# Patient Record
Sex: Male | Born: 1955
Health system: Southern US, Community
[De-identification: ages and names within clinical notes are randomized; demographics above are authoritative.]

## PROBLEM LIST (undated history)

## (undated) DIAGNOSIS — E785 Hyperlipidemia, unspecified: Secondary | ICD-10-CM

## (undated) DIAGNOSIS — Z9289 Personal history of other medical treatment: Secondary | ICD-10-CM

## (undated) DIAGNOSIS — I25119 Atherosclerotic heart disease of native coronary artery with unspecified angina pectoris: Secondary | ICD-10-CM

## (undated) DIAGNOSIS — I10 Essential (primary) hypertension: Secondary | ICD-10-CM

## (undated) DIAGNOSIS — I447 Left bundle-branch block, unspecified: Secondary | ICD-10-CM

## (undated) DIAGNOSIS — G4733 Obstructive sleep apnea (adult) (pediatric): Principal | ICD-10-CM

## (undated) HISTORY — DX: Obstructive sleep apnea (adult) (pediatric): G47.33

## (undated) HISTORY — DX: Essential (primary) hypertension: I10

## (undated) HISTORY — DX: Hyperlipidemia, unspecified: E78.5

## (undated) HISTORY — DX: Atherosclerotic heart disease of native coronary artery with unspecified angina pectoris: I25.119

## (undated) HISTORY — DX: Left bundle-branch block, unspecified: I44.7

## (undated) HISTORY — DX: Personal history of other medical treatment: Z92.89

---

## 1998-03-25 ENCOUNTER — Ambulatory Visit (HOSPITAL_COMMUNITY): Admission: RE | Admit: 1998-03-25 | Discharge: 1998-03-25 | Payer: Self-pay | Admitting: Anesthesiology

## 1998-10-28 ENCOUNTER — Ambulatory Visit (HOSPITAL_COMMUNITY): Admission: RE | Admit: 1998-10-28 | Discharge: 1998-10-28 | Payer: Self-pay | Admitting: Neurology

## 1998-10-28 ENCOUNTER — Encounter: Payer: Self-pay | Admitting: Neurology

## 1998-10-28 ENCOUNTER — Ambulatory Visit (HOSPITAL_COMMUNITY): Admission: RE | Admit: 1998-10-28 | Discharge: 1998-10-28 | Payer: Self-pay | Admitting: Family Medicine

## 1998-10-29 ENCOUNTER — Ambulatory Visit (HOSPITAL_COMMUNITY): Admission: RE | Admit: 1998-10-29 | Discharge: 1998-10-29 | Payer: Self-pay | Admitting: Anesthesiology

## 1999-03-25 ENCOUNTER — Encounter: Payer: Self-pay | Admitting: Family Medicine

## 1999-03-25 ENCOUNTER — Ambulatory Visit (HOSPITAL_COMMUNITY): Admission: RE | Admit: 1999-03-25 | Discharge: 1999-03-25 | Payer: Self-pay | Admitting: Family Medicine

## 1999-08-13 ENCOUNTER — Ambulatory Visit (HOSPITAL_COMMUNITY): Admission: RE | Admit: 1999-08-13 | Discharge: 1999-08-13 | Payer: Self-pay | Admitting: Gastroenterology

## 1999-08-13 ENCOUNTER — Encounter (INDEPENDENT_AMBULATORY_CARE_PROVIDER_SITE_OTHER): Payer: Self-pay | Admitting: *Deleted

## 2002-12-21 ENCOUNTER — Ambulatory Visit (HOSPITAL_BASED_OUTPATIENT_CLINIC_OR_DEPARTMENT_OTHER): Admission: RE | Admit: 2002-12-21 | Discharge: 2002-12-21 | Payer: Self-pay | Admitting: Urology

## 2002-12-21 ENCOUNTER — Encounter: Payer: Self-pay | Admitting: Urology

## 2003-06-27 ENCOUNTER — Ambulatory Visit (HOSPITAL_COMMUNITY): Admission: RE | Admit: 2003-06-27 | Discharge: 2003-06-28 | Payer: Self-pay | Admitting: Interventional Cardiology

## 2003-06-27 ENCOUNTER — Encounter: Payer: Self-pay | Admitting: Interventional Cardiology

## 2004-03-08 ENCOUNTER — Ambulatory Visit (HOSPITAL_COMMUNITY): Admission: RE | Admit: 2004-03-08 | Discharge: 2004-03-08 | Payer: Self-pay | Admitting: Urology

## 2004-05-08 ENCOUNTER — Inpatient Hospital Stay (HOSPITAL_BASED_OUTPATIENT_CLINIC_OR_DEPARTMENT_OTHER): Admission: RE | Admit: 2004-05-08 | Discharge: 2004-05-08 | Payer: Self-pay | Admitting: Interventional Cardiology

## 2004-05-16 ENCOUNTER — Inpatient Hospital Stay (HOSPITAL_COMMUNITY): Admission: RE | Admit: 2004-05-16 | Discharge: 2004-05-19 | Payer: Self-pay | Admitting: Cardiothoracic Surgery

## 2005-09-13 ENCOUNTER — Ambulatory Visit (HOSPITAL_COMMUNITY): Admission: RE | Admit: 2005-09-13 | Discharge: 2005-09-13 | Payer: Self-pay | Admitting: Urology

## 2006-01-21 ENCOUNTER — Ambulatory Visit (HOSPITAL_COMMUNITY): Admission: RE | Admit: 2006-01-21 | Discharge: 2006-01-21 | Payer: Self-pay | Admitting: Urology

## 2008-06-18 ENCOUNTER — Ambulatory Visit (HOSPITAL_COMMUNITY): Admission: RE | Admit: 2008-06-18 | Discharge: 2008-06-18 | Payer: Self-pay | Admitting: Obstetrics and Gynecology

## 2010-01-13 ENCOUNTER — Ambulatory Visit (HOSPITAL_COMMUNITY): Admission: RE | Admit: 2010-01-13 | Discharge: 2010-01-14 | Payer: Self-pay | Admitting: Interventional Cardiology

## 2010-04-22 IMAGING — CR DG CHEST 2V
2 series · 2 of 2 positions shown · non-contrast
Comparison: Two-view chest radiograph 03/08/2004

CLINICAL DATA: Left scapular pain.

CHEST - 2 VIEW

[view not recorded (1 of 2)]
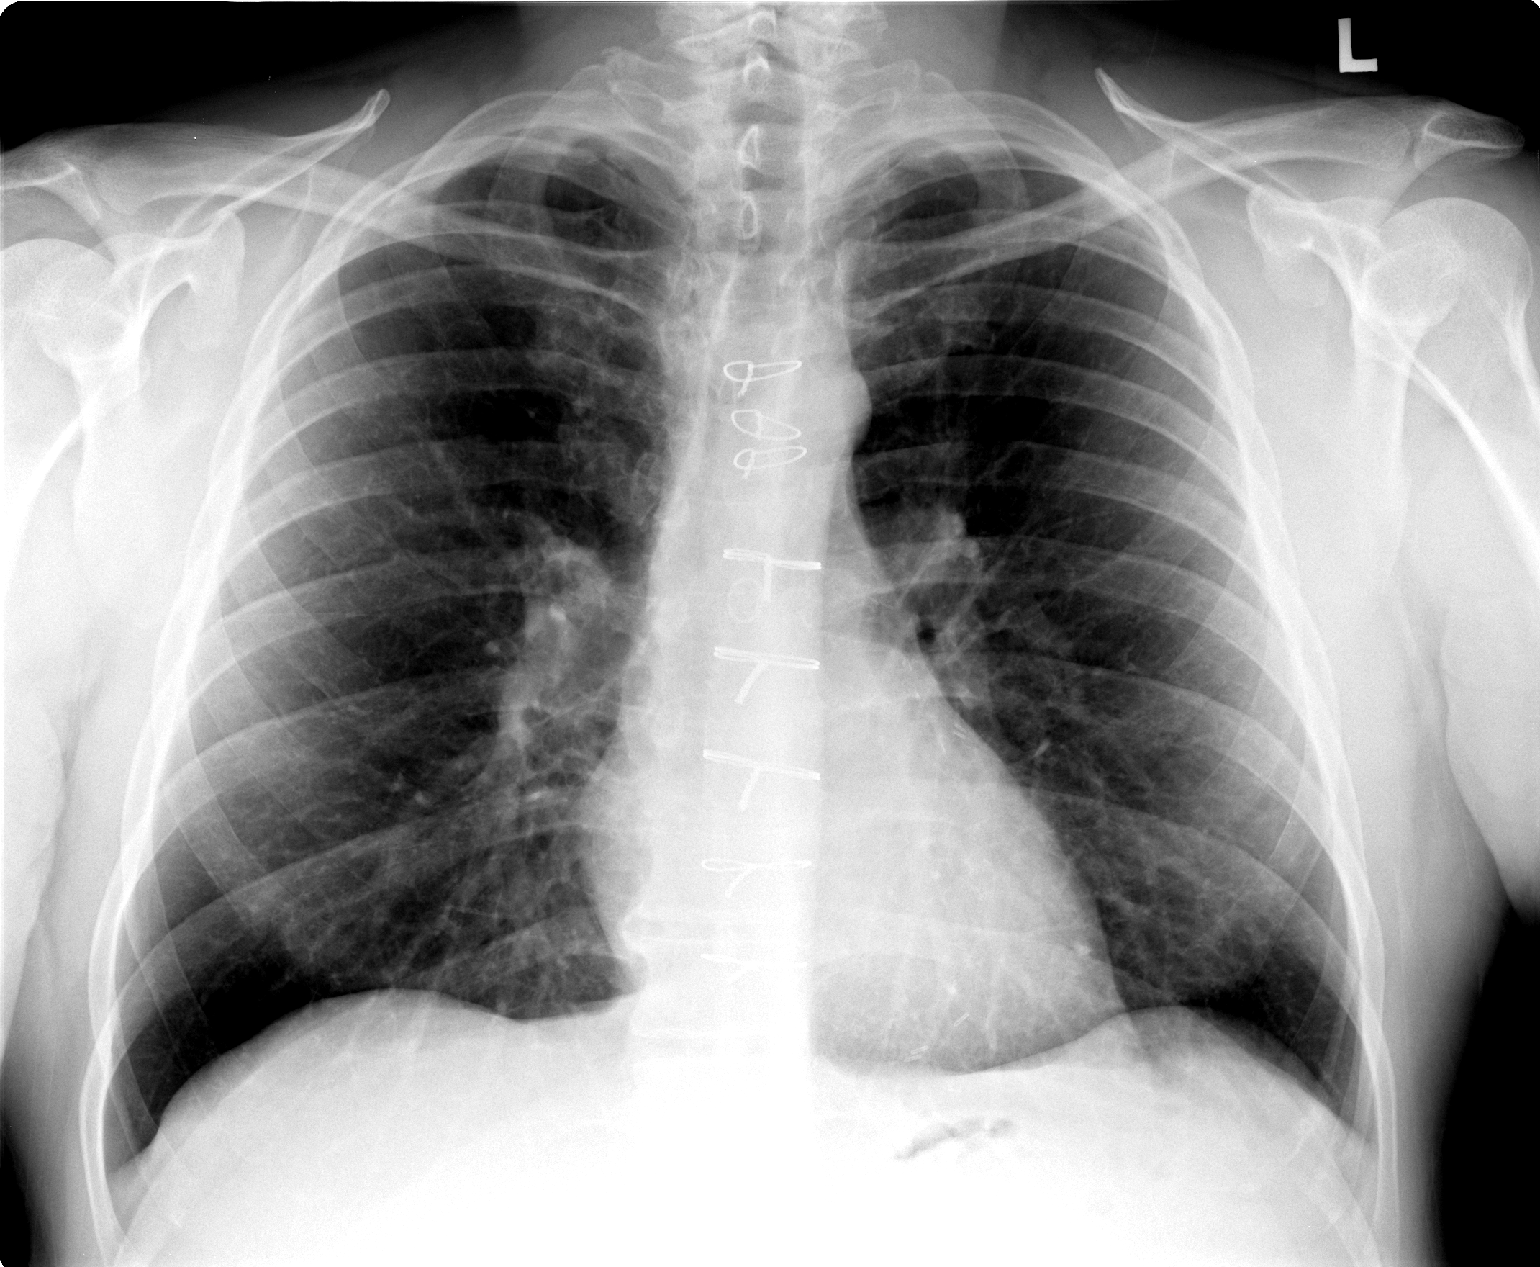

[view not recorded (2 of 2)]
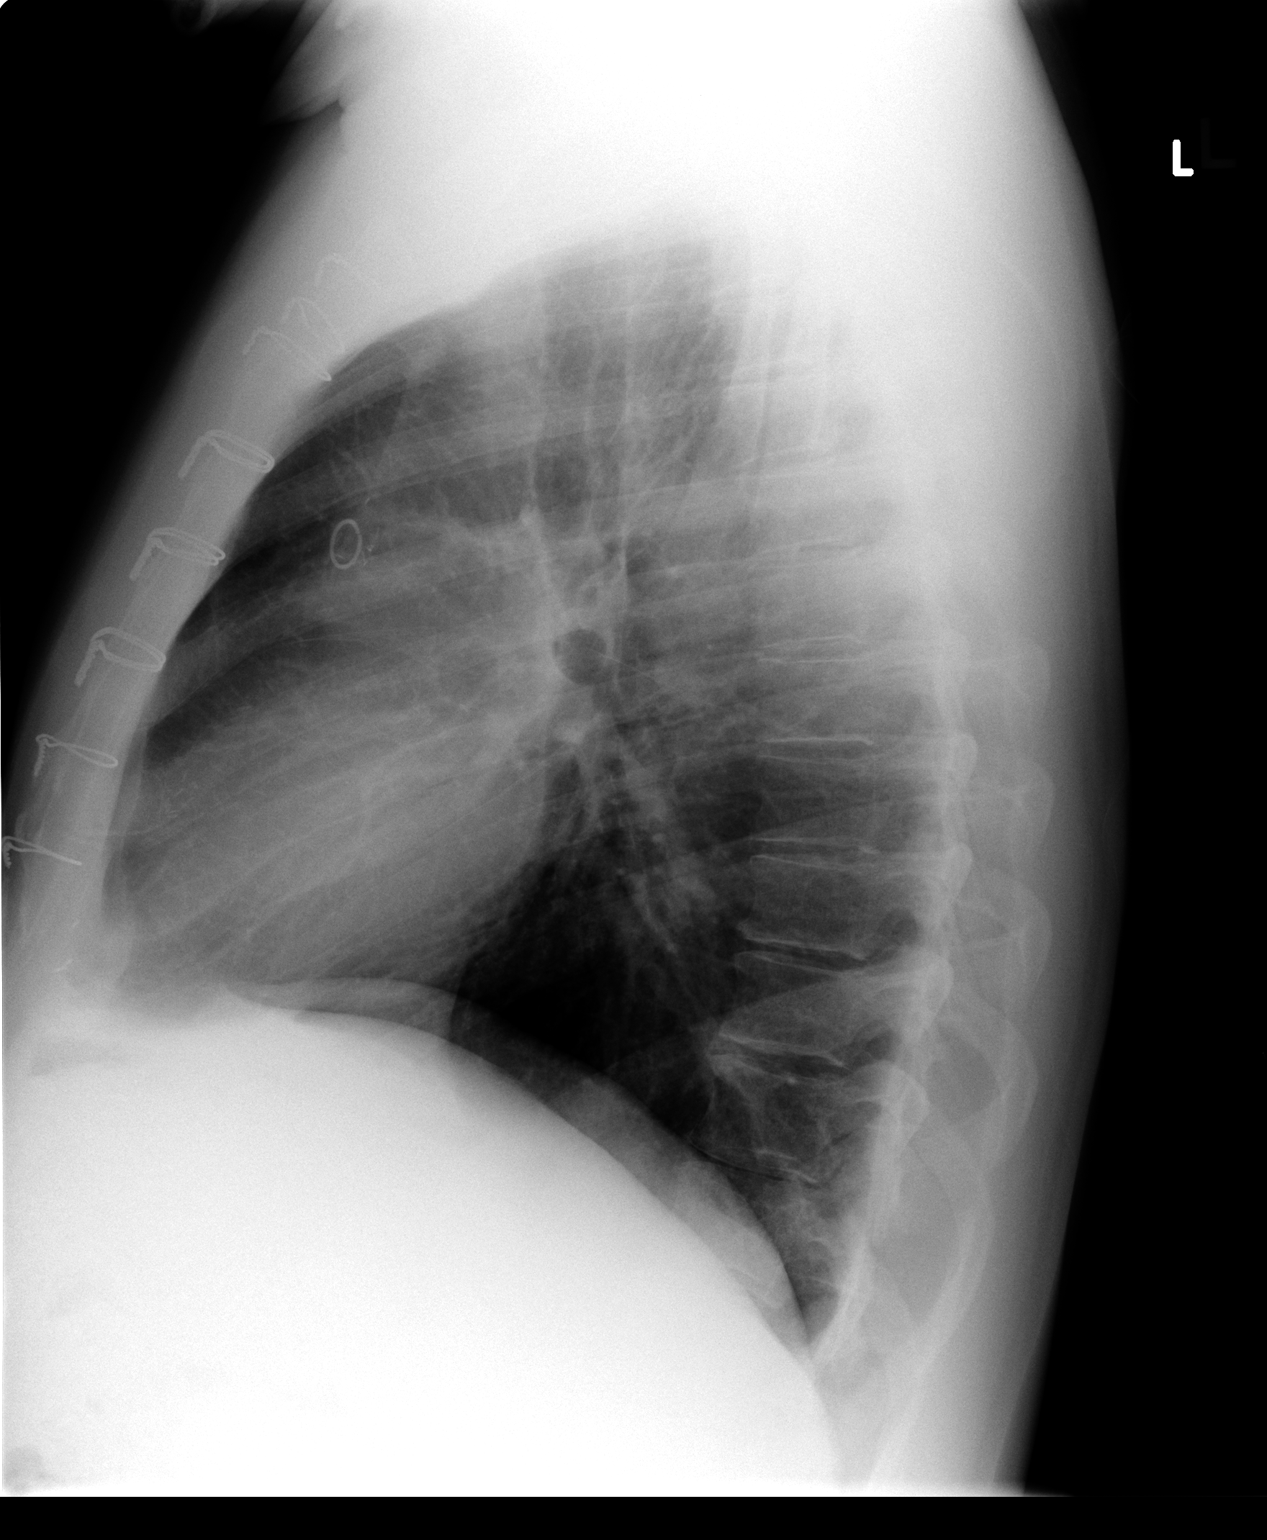

[2 of 2 positions shown; findings below may reference images not displayed]

FINDINGS: There are changes of median sternotomy for CABG.  There
are eight intact sternotomy wires.  Heart size is within normal
limits and stable.  Central pulmonary arteries are mildly prominent
and stable.  Both lungs are well expanded and clear.  No focal
airspace opacity, effusion, edema, or pneumothorax is identified.
Bony structures are unremarkable.
IMPRESSION: Postsurgical changes of CABG.  No acute findings in the chest.

## 2010-11-25 LAB — CBC
HCT: 45 % (ref 39.0–52.0)
Hemoglobin: 15.9 g/dL (ref 13.0–17.0)
MCHC: 35.4 g/dL (ref 30.0–36.0)
MCV: 94 fL (ref 78.0–100.0)
Platelets: 150 10*3/uL (ref 150–400)
RBC: 4.78 MIL/uL (ref 4.22–5.81)

## 2010-11-25 LAB — BASIC METABOLIC PANEL
Calcium: 9.1 mg/dL (ref 8.4–10.5)
Chloride: 106 mEq/L (ref 96–112)

## 2011-01-23 NOTE — Cardiovascular Report (Signed)
NAME:  Mark Payne, Mark Payne                         ACCOUNT NO.:  [id number]   MEDICAL RECORD NO.:  [id number]                   PATIENT TYPE:  OIB   LOCATION:  6501                                 FACILITY:  [facility]   PHYSICIAN:  Lesleigh Noe, [name]            DATE OF BIRTH:  [birth year]   DATE OF PROCEDURE:  05/08/2004  DATE OF DISCHARGE:  05/08/2004                              CARDIAC CATHETERIZATION   INDICATION:  History of coronary artery disease, recent abnormal Cardiolite  with apical ischemia and possible inferior ischemia, recent progression of  symptoms since the Cardiolite.  The study is being done to reassess coronary  anatomy.  Has a history of LAD drug-eluting stent implantation in October  2004.   PROCEDURE PERFORMED:  1.  Left heart catheterization.  2.  Selective coronary angiography.  3.  Left ventriculography.   DESCRIPTION:  After informed consent, a 4-French sheath was placed in the  right femoral artery using modified Seldinger technique.  A 4-French A2  multipurpose catheter was used for hemodynamic recordings, left  ventriculography by hand injection and attempted left and right coronary  angiography.  We subsequently had to use 4-French #4 left and right Judkins  catheters respectively.  The patient the tolerated the procedure without  complications.   RESULTS:   I. HEMODYNAMIC DATA:  A.  Left ventricular pressure:  113/8.  B.  Aortic pressure:  118/66.   II. LEFT VENTRICULOGRAPHY:  The left ventricle is normal in size and  demonstrates normal contractility.  EF is greater than 60%.  No MR.   III. CORONARY ANGIOGRAPHY:  A.  Left main coronary:  Distal 20% narrowing.  The vessel is widely patent without high grade obstruction.  B.  Left anterior descending coronary:  The LAD contains a proximal LAD  stent.  The stent is highly stenosed just distal to the origin of the first  diagonal.  This stenosis is at least 95% and is within the proximal margin  of the stent.  The remainder of the stent is widely patent.  The LAD is  otherwise diffusely diseased in the mid segment up to 90% and there is a  discrete 80-90% lesion within a diffusely diseased distal segment.  The  first diagonal contains proximal segmental 60-70% stenosis and the ostium of  the second diagonal contains a 70-80% stenosis.  C.  Circumflex artery:  Gives origin to one obtuse marginal branch.  There  is proximal segment narrowing up to 50%.  It is eccentric.  D.  Right coronary:  The right coronary initially before the intracoronary  administration of nitroglycerin appeared to be generally narrowed.  There is  30-50% proximal narrowing and distal 40-60% narrowing over segment before  the PDA.   CONCLUSION:  1.  In-stent restenosis of a Cypher drug-eluting stent in the proximal left      anterior descending to 95%.  There is severe diffuse mid and  distal left      anterior descending disease as well as disease involving the first and      second diagonal.  2.  There is moderate disease involving the circumflex and right coronary.  3.  Normal left ventricular function.   PLAN:  Consider surgical consultation for consideration of surgical  revascularization.  PCI on the LAD would involve extensive stenting probably  removing the possibility of coronary artery bypass grafting in the future  and would also risk loss of side branches.  Will discuss this with the  patient and before proceeding with any further interventional therapy will  encourage surgical evaluation.  With restenosis in stent in the LAD, our  future success of vessel patency with our current technology is  significantly impaired with an estimated restenosis rate in this region of  at least 30% with the best of current technology.                                               Lesleigh Noe, [name]    HWS/MEDQ  D:  05/08/2004  T:  05/09/2004  Job:  [id number]   cc:   Thelma Barge P. Modesto Charon, [name]  [address]  [place]  [place] [address]  Fax: [phone]

## 2011-01-23 NOTE — Procedures (Signed)
[facility]  Patient:    Mark Payne                       MRN: [id number] Proc. Date: 08/13/99 Adm. Date:  [id number] Attending:  Rich Brave CC:         Redmond Baseman, [name]                           Procedure Report  PROCEDURE PERFORMED:  Colonoscopy with biopsies.  ENDOSCOPIST:  Florencia Reasons, [name]  INDICATIONS:  A 55 year old for screening colonoscopy.  FINDINGS:  Several diminutive polyps in the proximal ascending colon, biopsied.  DESCRIPTION OF PROCEDURE:  The nature, purpose and risks of the procedure were familiar to the patient who provided written consent.  Sedation was fentanyl 50 mcg and Versed 6 mg IV without arrhythmias or desaturation.  Digital exam of the prostate showed some slight bumpiness but no discrete nodules.  The prostate was nontender and not particularly firm.  The Olympus adult video colonoscope was advanced easily around the colon, placing the patient in the supine position and applying some external abdominal compression to facilitate passage into the base of the cecum as identified by fair visualization of the appendiceal orifice as well as the ileocecal valve.  Pullback was then performed.  Just above the cecum were several tiny (2 to 3 mm) sessile polyps, each removed by one or two cold biopsies with good hemostasis.  No other polyps were seen and there was no evidence of cancer, colitis, vascular malformations or diverticular disease. Retroflexion in the rectum was normal.  The quality of the prep was excellent, o it is felt that all areas were well seen.  The patient tolerated the procedure well and there were no apparent complications.  IMPRESSION:  Tiny proximal colonic polyps, excised by cold biopsy technique.  PLAN:  Await pathology on the biopsies with consideration for follow-up colonoscopy in about five years, depending on the histologic findings. DD:   08/13/99 TD:  08/14/99 Job: [id number]

## 2011-01-23 NOTE — Op Note (Signed)
NAME:  Mark Payne, Mark Payne                         ACCOUNT NO.:  [id number]   MEDICAL RECORD NO.:  [id number]                   PATIENT TYPE:  INP   LOCATION:  2021                                 FACILITY:  [facility]   PHYSICIAN:  Gwenith Daily. Tyrone Sage, [name]            DATE OF BIRTH:  October 21, 1955   DATE OF PROCEDURE:  05/16/2004  DATE OF DISCHARGE:                                 OPERATIVE REPORT   PREOPERATIVE DIAGNOSES:  Unstable angina and coronary occlusive disease  status post stent placement.   POSTOPERATIVE DIAGNOSIS:  Unstable angina and coronary occlusive disease  status post stent placement.   SURGICAL PROCEDURE:  Coronary artery bypass grafting x 3 with the left  internal mammary artery sequentially to the mid LAD and distal LAD, reversed  left radial artery graft to the first diagonal.   SURGEON:  Edward B. Tyrone Sage, [name]   FIRST ASSISTANT:  Carolyn A. Eustaquio Boyden.   BRIEF HISTORY:  The patient is a 55 year old male with a strong family  history of coronary artery disease who approximately one year previously  began having shortness of breath and was evaluated and noted to have  coronary occlusive disease primarily in the proximal LAD.  A drug eluting  stent was placed at that time.  Initially, the patient did well, however,  over the past weeks began having episodes of chest discomfort when  exercising in spite of increasing beta blockers.  Repeat cardiac  catheterization revealed a high grade stenosis at the proximal portion of  the drug-coated stent and also progression of disease in a relatively large  first diagonal.  The patient had luminal irregularities in the right  coronary artery and circumflex coronary artery, however, none of these were  of a critical nature.  Because of the patient's progression of disease and  site of stent restenosis, it was felt that coronary artery bypass grafting  was indicated.  The patient agreed and signed informed consent.   DESCRIPTION OF PROCEDURE:  With a Swan-Ganz catheter and arterial monitors  placed, the patient underwent general endotracheal anesthesia without  incident.  The skin of the chest and legs and left arm was prepped and  draped in the usual sterile manner.  The palmar arch of the left hand was  patent by Doppler studies with radial occlusion.  It was elected to harvest  the left radial artery, which was performed with an open technique.  A  curvilinear incision was made over the course of the radial artery.  Dissection was carried down to the radial artery and then using the Harmonic  scalpel the radial artery was harvested.  It was of excellent quality and  caliber.  It was flushed with heparinized blood solution.  The incision was  then closed with a running 3-0 Vicryl in the subcutaneous tissues and a 4-0  subcuticular stitch on skin edge.  Dry dressings were applied.  A median  sternotomy was  performed. The left internal mammary artery was then  dissected down to the pedicle graft.  The distal artery was divided and had  excellent free flow and was of good quality.  The pericardium was opened.  Overall, the ventricular function appeared preserved.  The patient was  systemically heparinized.  The ascending aorta and right atrium were  cannulated.  An aortic root cardioplegia needle was then introduced into the  ascending aorta.  The patient was placed on cardiopulmonary bypass at 2.4  liters per minute per meter squared.  The sites of anastomosis were selected  and dissected out of the epicardium.  Angiographically, the patient had  proximal and mid LAD disease and it was decided to sequentially place the  left internal mammary artery to the mid and distal LAD.  The patient's body  temperature was cooled to 30 degrees.  The aortic cross clamp was brought  up; 500 cc of cold blood potassium cardioplegia was administered with rapid  diastolic arrest of the heart.  Bicardial septal temperature  was monitored  throughout the cross clamp period.  Attention was turned first to the first  diagonal vessel, which was opened and was a larger vessel than appreciated  angiographically.  It admitted a 1.5 mm probe.  Using a running 8-0 Prolene,  distal anastomosis was performed with the radial artery.  Attention was then  turned to the mid portion of the LAD, which was opened and admitted a  1.5  mm probe proximally.  A 1 mm probe passed a short distance distally and then  met obstruction.  Past this obstruction, the LAD was then opened and the  more distal vessel to the apex admitted a 1.5 mm probe.  Using a  longitudinal side to side anastomosis between the LAD and left internal  mammary artery, it was carried out first with a running 8-0 Prolene.  The  distal stent of the mammary artery was then trimmed to the appropriate  length and the distal anastomosis was also performed with a running 8-0  Prolene.  Attention was then turned to the proximal aorta where the  previously placed aortic cardioplegia needle was removed and a punch  aortotomy was created. Using a running 7-0 Prolene the proximal anastomosis  of the radial artery was created.  Air was evacuated from the heart and the  cross clamp removed.  The anastomosis was completed.  The patient  spontaneously converted to a sinus rhythm.  The fascia of the mammary artery  was tacked to the epicardium.  Cross clamp time was 71 minutes.  With the  operative field hemostatic, two atrial and two ventricular pacing wires were  applied.  Graft marker was applied.  The patient was then ventilated and  weaned from cardiopulmonary bypass without difficulty and made  hemodynamically stable.  He was decannulated in the usual fashion.  Protamine sulfate was administered with the operative field hemostatic.  The  pericardium was loosely reapproximated.  A left pleural tube and two mediastinal tubes were left in place.  The sternum was closed with a  #6  stainless steel wire.  The fascia was closed with interrupted 0 Vicryl.  Using the On-Q drug administration system parallel to the incision on each  side and overlying the pericostal margin, the catheters were placed and  attached to the On-Q to assist in decreasing postoperative pain.  The  patient did not require any blood bank blood products and was transferred to  the surgical intensive care unit  for further postoperative care, having  tolerated the procedure without complication.                                               Gwenith Daily Tyrone Sage, [name]    Tyson Babinski  D:  05/18/2004  T:  05/18/2004  Job:  [id number]   cc:   Lyn Records III, [name]  [address]  [address]  [place]  [place] [address]  Fax: [phone]

## 2011-01-23 NOTE — Discharge Summary (Signed)
NAME:  Mark Payne, Mark Payne                         ACCOUNT NO.:  [id number]   MEDICAL RECORD NO.:  [id number]                   PATIENT TYPE:  INP   LOCATION:  2021                                 FACILITY:  [facility]   PHYSICIAN:  Gwenith Daily. Tyrone Sage, [name]            DATE OF BIRTH:  1956/02/17   DATE OF ADMISSION:  05/16/2004  DATE OF DISCHARGE:  05/19/2004                                 DISCHARGE SUMMARY   PRIMARY ADMITTING DIAGNOSIS:  Recurrent coronary artery disease.   ADDITIONAL/DISCHARGE DIAGNOSES:  1.  Recurrent coronary artery disease.  2.  Coronary artery disease status post previous percutaneous transluminal      angioplasty and stent placement to the left anterior descending.  3.  Hyperlipidemia.  4.  History of removal of thyroglossal duct cyst.   PROCEDURES PERFORMED:  Coronary artery bypass grafting x 3 (sequential left  internal mammary artery to the mid left anterior descending and distal left  anterior descending, left radial artery to the first diagonal).   HISTORY:  The patient is a 55 year old white male with a known history of  coronary artery disease.  He presented initially in October 2004 with asthma-  like symptoms.  An evaluation at that time revealed evidence of a high grade  proximal LAD lesion.  He was seen by Dr. Verdis Prime and underwent a PTA and  placement of a CYPHER stent.  His symptoms resolved.  However, over the past  2-3 weeks, he has noticed increasing shortness of breath and vague chest  pressure with exertion particularly when riding his bike.  Because of his  recurrent symptoms, he saw Dr. Katrinka Blazing and underwent a repeat cardiac  catheterization on September 1.  At that time, his catheterization showed an  in-stent restenosis of the CYPHER stent in the proximal LAD which was about  95%.  There was diffuse mid LAD disease, a 70 to 80% first diagonal  stenosis, a 70 to 80% second diagonal stenosis, a 30 to 40% right proximal  stenosis, and a 30 to  40% circumflex stenosis.  Left ventricular function  was well preserved.  Because of the in-stent restenosis and diffuse disease,  he was referred to [facility] B. Tyrone Sage, [name], for consideration of surgical  revascularization.  Dr. Tyrone Sage saw the patient and agreed that proceeding  with CABG at this point was his best option.  He had previously been on  Plavix and this was discontinued with an eye towards surgery on May 16, 2004.  After explanation of the risks, benefits, and alternatives of the  procedure, the patient agreed to proceed.   HOSPITAL COURSE:  The patient was admitted on May 16, 2004, to [facility].  He was taken to the operating room where he underwent CABG x  3 as described in detail above.  He tolerated the procedure well and was  transferred to the SICU in stable condition.  Postoperatively, he has  done  very well.  He was able to be extubated shortly after surgery.  He was  hemodynamically stable and doing well on postoperative day #1.  At that  time, he was able to be transferred to the floor.  He has been treated with  Toradol for postoperative pericarditis and is improving.  He has been  diuresed gently.  He has been started back on his beta blocker and is  maintained on normal sinus rhythm.  He has been afebrile and all other vital  signs are stable.  He has been weaned off supplemental oxygen and is  maintaining O2 saturations of greater than 90% on room air.  He has been  started on Imdur for increased patency of his radial artery graft and is  tolerating this well.  His most recent labs show a hemoglobin of 9.9,  hematocrit 27.5, platelets 105, white count 10.7, potassium 4.0, BUN 9,  creatinine 1.1.  His most recent chest x-ray shows mild bibasilar  atelectasis.  On physical exam,  his surgical incision sites are all healing  well.  He does have a mild pericardial friction rub but otherwise stable.  It is felt that, if he continues to  improve over the next 24 hours, he will  hopefully be ready for discharge on May 19, 2004.   DISCHARGE MEDICATIONS:  1.  Enteric-coated aspirin 325 mg daily.  2.  Lopressor 25 mg b.i.d.  3.  Vytorin 10/20 one daily.  4.  Imdur 30 mg daily.  5.  Tylox one to two q.4h. p.r.n. for pain.   DISCHARGE INSTRUCTIONS:  He is asked to refrain from driving, heavy lifting,  or strenuous activity.  He may continue ambulating daily and using his  incentive spirometer.  He will continue a low-fat, low-sodium diet.  He may  shower daily and clean his incisions with soap and water.   DISCHARGE FOLLOW-UP:  He is asked to make an appointment to see Dr. Katrinka Blazing in  two weeks.  He will have a chest x-ray at that visit and should bring it to  the follow-up appointment with Dr. Tyrone Sage.  The [facility] office will contact  him with an appointment to see Dr. Tyrone Sage in three weeks.  In the interim,  if he experiences any problems or has questions, he is asked to contact our  office immediately.      Coral Ceo, P.A.                        Gwenith Daily Tyrone Sage, [name]    GC/MEDQ  D:  05/18/2004  T:  05/18/2004  Job:  [id number]   cc:   Lesleigh Noe, [name]  [address]  [address]  [place]  [place] [address]  Fax: [phone]   Maryla Morrow. Modesto Charon, [name]  [address]  [place]  [place] [address]  Fax: [phone]

## 2011-11-10 ENCOUNTER — Other Ambulatory Visit: Payer: Self-pay | Admitting: Interventional Cardiology

## 2011-11-12 ENCOUNTER — Encounter (HOSPITAL_COMMUNITY): Payer: Self-pay | Admitting: Pharmacy Technician

## 2011-11-13 ENCOUNTER — Ambulatory Visit (HOSPITAL_COMMUNITY)
Admission: RE | Admit: 2011-11-13 | Discharge: 2011-11-13 | Disposition: A | Discharge status: Home / Self Care | Payer: PRIVATE HEALTH INSURANCE | Source: Ambulatory Visit | Attending: Interventional Cardiology | Admitting: Interventional Cardiology

## 2011-11-13 ENCOUNTER — Other Ambulatory Visit: Payer: Self-pay

## 2011-11-13 ENCOUNTER — Encounter (HOSPITAL_COMMUNITY)
Admission: RE | Disposition: A | Discharge status: Home / Self Care | Payer: Self-pay | Source: Ambulatory Visit | Attending: Interventional Cardiology

## 2011-11-13 DIAGNOSIS — E785 Hyperlipidemia, unspecified: Secondary | ICD-10-CM | POA: Insufficient documentation

## 2011-11-13 DIAGNOSIS — R0989 Other specified symptoms and signs involving the circulatory and respiratory systems: Secondary | ICD-10-CM | POA: Insufficient documentation

## 2011-11-13 DIAGNOSIS — R0609 Other forms of dyspnea: Secondary | ICD-10-CM | POA: Insufficient documentation

## 2011-11-13 DIAGNOSIS — Z9861 Coronary angioplasty status: Secondary | ICD-10-CM | POA: Insufficient documentation

## 2011-11-13 DIAGNOSIS — I2510 Atherosclerotic heart disease of native coronary artery without angina pectoris: Secondary | ICD-10-CM | POA: Insufficient documentation

## 2011-11-13 DIAGNOSIS — R0789 Other chest pain: Secondary | ICD-10-CM | POA: Insufficient documentation

## 2011-11-13 DIAGNOSIS — Z951 Presence of aortocoronary bypass graft: Secondary | ICD-10-CM | POA: Insufficient documentation

## 2011-11-13 HISTORY — PX: LEFT HEART CATHETERIZATION WITH CORONARY/GRAFT ANGIOGRAM: SHX5450

## 2011-11-13 SURGERY — LEFT HEART CATHETERIZATION WITH CORONARY/GRAFT ANGIOGRAM
Anesthesia: LOCAL

## 2011-11-13 MED ORDER — SODIUM CHLORIDE 0.9 % IV SOLN
250.0000 mL | INTRAVENOUS | Status: DC | PRN
  Administered 2011-11-13: 10:00:00 via INTRAVENOUS

## 2011-11-13 MED ORDER — NITROGLYCERIN 0.2 MG/ML ON CALL CATH LAB
INTRAVENOUS | Status: AC
  Filled 2011-11-13: qty 1

## 2011-11-13 MED ORDER — OXYCODONE-ACETAMINOPHEN 5-325 MG PO TABS: 1.0000 | ORAL_TABLET | ORAL | Status: DC | PRN

## 2011-11-13 MED ORDER — MIDAZOLAM HCL 2 MG/2ML IJ SOLN
INTRAMUSCULAR | Status: AC
  Filled 2011-11-13: qty 2

## 2011-11-13 MED ORDER — FENTANYL CITRATE 0.05 MG/ML IJ SOLN
INTRAMUSCULAR | Status: AC
  Filled 2011-11-13: qty 2

## 2011-11-13 MED ORDER — SODIUM CHLORIDE 0.9 % IJ SOLN: 3.0000 mL | Freq: Two times a day (BID) | INTRAMUSCULAR | Status: DC

## 2011-11-13 MED ORDER — ASPIRIN 81 MG PO CHEW: 324.0000 mg | CHEWABLE_TABLET | ORAL | Status: DC

## 2011-11-13 MED ORDER — HEPARIN (PORCINE) IN NACL 2-0.9 UNIT/ML-% IJ SOLN
INTRAMUSCULAR | Status: AC
  Filled 2011-11-13: qty 2000

## 2011-11-13 MED ORDER — LIDOCAINE HCL (PF) 1 % IJ SOLN
INTRAMUSCULAR | Status: AC
  Filled 2011-11-13: qty 30

## 2011-11-13 MED ORDER — ACETAMINOPHEN 325 MG PO TABS
ORAL_TABLET | ORAL | Status: AC
  Filled 2011-11-13: qty 2

## 2011-11-13 MED ORDER — DIAZEPAM 5 MG PO TABS
ORAL_TABLET | ORAL | Status: AC
  Administered 2011-11-13: 5 mg via ORAL
  Filled 2011-11-13: qty 1

## 2011-11-13 MED ORDER — ALPRAZOLAM 0.25 MG PO TABS: 0.2500 mg | ORAL_TABLET | Freq: Two times a day (BID) | ORAL | Status: DC | PRN

## 2011-11-13 MED ORDER — DIAZEPAM 5 MG PO TABS
5.0000 mg | ORAL_TABLET | ORAL | Status: AC
  Administered 2011-11-13: 5 mg via ORAL

## 2011-11-13 MED ORDER — SODIUM CHLORIDE 0.9 % IJ SOLN: 3.0000 mL | INTRAMUSCULAR | Status: DC | PRN

## 2011-11-13 MED ORDER — SODIUM CHLORIDE 0.9 % IV SOLN: INTRAVENOUS | Status: AC

## 2011-11-13 MED ORDER — ACETAMINOPHEN 325 MG PO TABS
650.0000 mg | ORAL_TABLET | ORAL | Status: DC | PRN
  Administered 2011-11-13: 650 mg via ORAL

## 2011-11-13 MED ORDER — SODIUM CHLORIDE 0.9 % IV SOLN: INTRAVENOUS | Status: DC

## 2011-11-13 MED ORDER — ONDANSETRON HCL 4 MG/2ML IJ SOLN: 4.0000 mg | Freq: Four times a day (QID) | INTRAMUSCULAR | Status: DC | PRN

## 2011-11-13 NOTE — Progress Notes (Signed)
Advised Mark Payne pt took am meds of ASA 324mg  & plavix 75mg . RN advised by Vinnie Langton hold ASA 324mg  & plavix 75mg  ordered by MD.

## 2011-11-13 NOTE — Op Note (Signed)
     Diagnostic Cardiac Catheterization Report  Mark Payne  56 y.o.  male December 12, 1955  Procedure Date: 11/13/2011  Referring Physician: [name], [name] Primary Cardiologist:: [name], [name]   PROCEDURE:  Left heart catheterization with selective coronary angiography, left ventriculogram.  INDICATIONS:  Recurring angina at rest and on exertion. 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 He had stents prior to CABG. Developed restenosis and had an all arterial graft procedure performed. Within the past 3 years he has had stenting of the right coronary artery with DES.  The risks, benefits, and details of the procedure were explained to the patient.  The patient verbalized understanding and wanted to proceed.  Informed written consent was obtained.  PROCEDURE TECHNIQUE:  After Xylocaine anesthesia a 5 French sheath was placed in the right femoral artery with a single anterior needle wall stick.   Coronary angiography was done using a 5 [place] A2 MP, 5 [place] internal mammary, and 5 [place] JR 4 catheter.  Left ventriculography was done using a 5 [place] A2 MP catheter.    CONTRAST:  Total of 100 cc.  COMPLICATIONS:  None.    HEMODYNAMICS:  Aortic pressure was 114/68 mmHg; LV pressure was 110/11; LVEDP 12 mm mercury.  There was no gradient between the left ventricle and aorta.    ANGIOGRAPHIC DATA:   The left main coronary artery is widely patent.  The left anterior descending artery is 95% obstruction before and after the proximal LAD stent. Beyond this point a moderate size septal perforator is noted and there is weak retrograde competitive flow from the distal LAD which is supplied by the LIMA graft. The cell to perforator is threatened. This appearance is unchanged from the cath performed prior to RCA stenting several years ago.  The left circumflex artery is eccentric mid 50% narrowing. Small to moderate in size advocating of the mid  inferolateral wall..  The right coronary artery is  Dominant vessel with left ventricular branches and PDA. Multiple luminal irregularities are noted throughout the right coronary. The mid vessel and distal stents previously placed a widely patent. The right coronary was imaged after 200 mcg of intracoronary nitroglycerin. No evidence of restenosis is noted to.  LEFT VENTRICULOGRAM:  Left ventricular angiogram was done in the 30 RAO projection and revealed normal left ventricular wall motion and systolic function with an estimated ejection fraction of 60 %.  BYPASS GRAFTS:   Free radial to diagonal graft: Widely patent.  LIMA to LAD: Widely patent  IMPRESSIONS:  1. Severe native coronary disease with high-grade/subtotal occlusion of the proximal LAD and threatened cell to perforator #1. This anatomy is unchanged from the last catheterization several years ago..  2. Eccentric 50-60% stenosis in the mid circumflex.  3. Diffusely diseased native right coronary artery with widely patent mid and distal stent and no focal obstruction greater than 50%.  4. Patent LIMA to LAD. Patent free radial graft to diagonal.  5. Normal left ventricular systolic function   RECOMMENDATION:    1. Attempt to identify alternative explanations for the patient's discomfort.  2. If this discomfort is cardiac at this time and was seem that soba perforated territory ischemia could be the source. Pes valgus region could be done but given the absence of significant change in appearance since the last study, conservative management will be undertaken at this time.  3. Will review and compare to prior studies.Marland Kitchen

## 2011-11-13 NOTE — H&P (Signed)
See scanned office notes 

## 2011-11-13 NOTE — Discharge Instructions (Signed)

## 2011-11-13 NOTE — H&P (Signed)
There is no change in her clinical status since the pre-procedure H&P was performed last week in the office. They have been intermittent episodes of prolonged heaviness in the chest responsive to nitroglycerin. The patient has extensive coronary disease with prior LIMA to LAD and a radial graft to the first diagonal. Drug alluding stents were implanted into the mid and distal right coronary subsequent to surgery. This study is being done to define graft patency and to rule out restenosis and/or progression of native disease.

## 2014-04-02 ENCOUNTER — Ambulatory Visit (INDEPENDENT_AMBULATORY_CARE_PROVIDER_SITE_OTHER): Payer: PRIVATE HEALTH INSURANCE | Admitting: Interventional Cardiology

## 2014-04-02 ENCOUNTER — Encounter: Payer: Self-pay | Admitting: Interventional Cardiology

## 2014-04-02 VITALS — BP 132/84 | HR 50 | Wt 181.0 lb

## 2014-04-02 DIAGNOSIS — I208 Other forms of angina pectoris: Secondary | ICD-10-CM | POA: Insufficient documentation

## 2014-04-02 DIAGNOSIS — I2089 Other forms of angina pectoris: Secondary | ICD-10-CM | POA: Insufficient documentation

## 2014-04-02 DIAGNOSIS — E785 Hyperlipidemia, unspecified: Secondary | ICD-10-CM

## 2014-04-02 DIAGNOSIS — I447 Left bundle-branch block, unspecified: Secondary | ICD-10-CM

## 2014-04-02 DIAGNOSIS — I25119 Atherosclerotic heart disease of native coronary artery with unspecified angina pectoris: Secondary | ICD-10-CM | POA: Insufficient documentation

## 2014-04-02 DIAGNOSIS — I25810 Atherosclerosis of coronary artery bypass graft(s) without angina pectoris: Secondary | ICD-10-CM

## 2014-04-02 DIAGNOSIS — I209 Angina pectoris, unspecified: Secondary | ICD-10-CM

## 2014-04-02 DIAGNOSIS — I10 Essential (primary) hypertension: Secondary | ICD-10-CM

## 2014-04-02 DIAGNOSIS — I25709 Atherosclerosis of coronary artery bypass graft(s), unspecified, with unspecified angina pectoris: Secondary | ICD-10-CM

## 2014-04-02 HISTORY — DX: Left bundle-branch block, unspecified: I44.7

## 2014-04-02 HISTORY — DX: Essential (primary) hypertension: I10

## 2014-04-02 HISTORY — DX: Hyperlipidemia, unspecified: E78.5

## 2014-04-02 HISTORY — DX: Atherosclerotic heart disease of native coronary artery with unspecified angina pectoris: I25.119

## 2014-04-02 MED ORDER — NITROGLYCERIN 0.4 MG SL SUBL: 0.4000 mg | SUBLINGUAL_TABLET | SUBLINGUAL | Status: DC | PRN

## 2014-04-02 NOTE — Progress Notes (Signed)
Patient ID: Mark Payne, male   DOB: Oct 26, 1955, 58 y.o.   MRN: [id number]    [address], [address] [place], [place]  [address] Phone: [phone] Fax:  [phone]  Date:  04/02/2014   ID:  Mark, Payne 1956/02/12, MRN [id number]  PCP:  No primary provider on file.   ASSESSMENT:  1. 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 Hyperlipidemia, but currently off statin therapy 3. New Left Bundle branch block 4. Hypertension 5. Medication noncompliance  PLAN:  1. Prescription for nitroglycerin is prescribed to his pharmacy. He is asked to use it if he has prolonged chest discomfort . 2. Pharmacologic nuclear perfusion study as soon as possible. The patient refuses to have it prior to September 1 because of insurance coverage labs 3. 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 He is not complaining of chest discomfort but had an EKG tracing done at his workplace in [place] and noted that he had a widened QRS complex and became concerned. He states that he had discontinued exercise for quite some time, stopped taking his medications including statin therapy, and was under a lot of stress related to his job. He now has a new job in [place] for the past month has begun exercising with his daughter. He notes that after exercise he has tightness in his chest. It has been intermittent. It is similar to prior complaints. After noticing the change in his EKG he felt that he should be seen urgently despite the fact that we had no office lots. We have recommended emergency room but he refused to go.   Wt Readings from Last 3 Encounters:  04/02/14 181 lb (82.101 kg)  11/13/11 190 lb (86.183 kg)  11/13/11 190 lb (86.183 kg)     Past Medical History  Diagnosis Date    . LBBB (left bundle branch block) 04/02/2014  . Hyperlipidemia 04/02/2014  . Exertional angina 04/02/2014  . Essential hypertension 04/02/2014  . CAD (coronary artery disease) of artery bypass graft 04/02/2014    LIMA to LAD and free radial to diagonal #1 20075. PCI of RCA with DES May 2011  The most recent coronary angiogram in 2013 demonstrated a patent LIMA to LAD, patent free radial to diagonal, patent right coronary artery with a patent mid and distal stent. The first of perforator was threatened. The native LAD was subtotally occluded . It was felt that the anatomy was not significantly different than the prior cath in 2011.     Current Outpatient Prescriptions  Medication Sig Dispense Refill  . aspirin 81 MG tablet Take 81 mg by mouth daily.      . clopidogrel (PLAVIX) 75 MG tablet Take 75 mg by mouth daily.      . famotidine (PEPCID) 10 MG tablet Take 10 mg by mouth 1 day or 1 dose.      . nebivolol (BYSTOLIC) 10 MG tablet Take 10 mg by mouth daily.      . nitroGLYCERIN (NITROSTAT) 0.4 MG SL tablet Place 1 tablet (0.4 mg total) under the tongue every 5 (five) minutes as needed for chest pain.  25 tablet  3  . rosuvastatin (CRESTOR) 10 MG tablet Take 10 mg by mouth daily.  No current facility-administered medications for this visit.    Allergies:   No Known Allergies  Social History:  The patient  reports that he has quit smoking. He does not have any smokeless tobacco history on file. He reports that he drinks alcohol. He reports that he does not use illicit drugs.   ROS:  Please see the history of present illness.   He admits to his significant emotional stress. He denies palpitations and syncope. He denies exertional chest complaints but has angina at rest. He has not been using nitroglycerin. For some reason he is converted to a natural pathic approach rather than taking a statin and blood pressure therapy. He states he has lost 10 pounds by exercising recently. 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   All other systems reviewed and negative.   OBJECTIVE: VS:  BP 132/84  Pulse 50  Wt 181 lb (82.101 kg) Well nourished, well developed, in no acute distress, skin color is good. HEENT: normal Neck: JVD flat. Carotid bruit absent  Cardiac:  normal S1, S2; RRR; no murmur Lungs:  clear to auscultation bilaterally, no wheezing, rhonchi or rales Abd: soft, nontender, no hepatomegaly Ext: Edema absent. Pulses 2+ and symmetric bilateral Skin: warm and dry Neuro:  CNs 2-12 intact, no focal abnormalities noted  EKG:  Sinus bradycardia with left bundle branch block, left abnormality. New compared to 2013 tracings       Signed, Illene Labrador III, MD 04/02/2014 5:24 PM  Past Medical History  CAD with CABG with LIMA to LAD and fee radial to D#1, 2005; PCI with RCA stent 01/2010   Hyperlipidemia   Hypertension

## 2014-04-02 NOTE — Patient Instructions (Addendum)
Your physician has recommended you make the following change in your medication:  1) START Nitro as directed an Rx has been sent to your pharmacy   Your physician has requested that you have a lexiscan myoview. For further information please visit [email or web address]. Please follow instruction sheet, as given.

## 2014-05-08 ENCOUNTER — Ambulatory Visit (HOSPITAL_COMMUNITY): Payer: No Typology Code available for payment source | Attending: Cardiovascular Disease | Admitting: Radiology

## 2014-05-08 VITALS — BP 143/97 | HR 43 | Ht 68.0 in | Wt 180.0 lb

## 2014-05-08 DIAGNOSIS — I447 Left bundle-branch block, unspecified: Secondary | ICD-10-CM | POA: Insufficient documentation

## 2014-05-08 DIAGNOSIS — Z951 Presence of aortocoronary bypass graft: Secondary | ICD-10-CM | POA: Diagnosis not present

## 2014-05-08 DIAGNOSIS — I2510 Atherosclerotic heart disease of native coronary artery without angina pectoris: Secondary | ICD-10-CM | POA: Insufficient documentation

## 2014-05-08 DIAGNOSIS — I209 Angina pectoris, unspecified: Secondary | ICD-10-CM

## 2014-05-08 DIAGNOSIS — R0609 Other forms of dyspnea: Secondary | ICD-10-CM | POA: Diagnosis not present

## 2014-05-08 DIAGNOSIS — R9439 Abnormal result of other cardiovascular function study: Secondary | ICD-10-CM | POA: Insufficient documentation

## 2014-05-08 DIAGNOSIS — Z8249 Family history of ischemic heart disease and other diseases of the circulatory system: Secondary | ICD-10-CM | POA: Insufficient documentation

## 2014-05-08 DIAGNOSIS — Z9861 Coronary angioplasty status: Secondary | ICD-10-CM | POA: Diagnosis not present

## 2014-05-08 DIAGNOSIS — R0789 Other chest pain: Secondary | ICD-10-CM | POA: Diagnosis not present

## 2014-05-08 DIAGNOSIS — I25810 Atherosclerosis of coronary artery bypass graft(s) without angina pectoris: Secondary | ICD-10-CM

## 2014-05-08 DIAGNOSIS — R0989 Other specified symptoms and signs involving the circulatory and respiratory systems: Secondary | ICD-10-CM | POA: Insufficient documentation

## 2014-05-08 MED ORDER — TECHNETIUM TC 99M SESTAMIBI GENERIC - CARDIOLITE
30.0000 | Freq: Once | INTRAVENOUS | Status: AC | PRN
  Administered 2014-05-08: 30 via INTRAVENOUS

## 2014-05-08 MED ORDER — TECHNETIUM TC 99M SESTAMIBI GENERIC - CARDIOLITE
10.0000 | Freq: Once | INTRAVENOUS | Status: AC | PRN
  Administered 2014-05-08: 10 via INTRAVENOUS

## 2014-05-08 MED ORDER — ADENOSINE (DIAGNOSTIC) 3 MG/ML IV SOLN
0.5600 mg/kg | Freq: Once | INTRAVENOUS | Status: AC
  Administered 2014-05-08: 45.6 mg via INTRAVENOUS

## 2014-05-08 NOTE — Progress Notes (Signed)
Mark Payne, Mark Payne [phone]    Cardiology Nuclear Med Study  Mark Payne is a 58 y.o. male     MRN : [id number]     DOB: August 07, 1956  Procedure Date: 05/08/2014  Nuclear Med Background Indication for Stress Test:  Evaluation for Ischemia, Graft Patency and Stent Patency History:  CAD; CABG; Stent; 2011 MPI-mild ischemia Cardiac Risk Factors: Family History - CAD, History of Smoking, Hypertension, LBBB and Lipids  Symptoms:  Chest Tightness    Nuclear Pre-Procedure Caffeine/Decaff Intake:  None NPO After: 8:00pm   Lungs:  clear O2 Sat: 96% on room air. IV 0.9% NS with Angio Cath:  20g  IV Site: R Hand  IV Started by:  Matilde Haymaker, RN  Chest Size (in):  44 Cup Size: n/a  Height: 5\' 8"  (1.727 m)  Weight:  180 lb (81.647 kg)  BMI:  Body mass index is 27.38 kg/(m^2). Tech Comments:  Held bystolic 48GQB    Nuclear Med Study 1 or 2 day study: 1 day  Stress Test Type:  Adenosine  Reading MD: Wells Guiles, MD  Order Authorizing Provider:  Linard Millers, MD  Resting Radionuclide: Technetium 65m Sestamibi  Resting Radionuclide Dose: 11.0 mCi   Stress Radionuclide:  Technetium 67m Sestamibi  Stress Radionuclide Dose: 33.0 mCi           Stress Protocol Rest HR: 43 Stress HR: 69  Rest BP: 143/97 Stress BP: 147/93  Exercise Time (min): n/a METS: n/a           Dose of Adenosine (mg):  45.8 mg Dose of Lexiscan: n/a mg  Dose of Atropine (mg): n/a Dose of Dobutamine: n/a mcg/kg/min (at max HR)  Stress Test Technologist: Glade Lloyd, BS-ES  Nuclear Technologist:  Annye Rusk, CNMT     Rest Procedure:  Myocardial perfusion imaging was performed at rest 45 minutes following the intravenous administration of Technetium 11m Sestamibi. Rest ECG: NSR-LBBB  Stress Procedure:  The patient received IV adenosine at 140 mcg/kg/min for 4 minutes.  Technetium 46m Sestamibi was injected at the 2 minute mark and quantitative  spect images were obtained after a 45 minute delay.  During the infusion of Adenosine the patient complained of tension in his neck and chest pressure.  These symptoms completely resolved in recovery.  Stress ECG: No significant change from baseline ECG  QPS Raw Data Images:  Normal; no motion artifact; normal heart/lung ratio. Stress Images:  Fixed anteroseptal and apical defects Rest Images:  Fixed anteroseptal and apical defects Subtraction (SDS):  No evidence of ischemia. Transient Ischemic Dilatation (Normal <1.22):  1.16 Lung/Heart Ratio (Normal <0.45):  0.39  Quantitative Gated Spect Images QGS EDV:  160 ml QGS ESV:  81 ml  Impression Exercise Capacity:  Adenosine study with no exercise. BP Response:  Normal blood pressure response. Clinical Symptoms:  Mild chest pain/dyspnea. ECG Impression:  No significant ST segment change with adenosine. Comparison with Prior Nuclear Study: No previous nuclear study performed  Overall Impression:  Intermediate risk stress nuclear study with fixed anteroseptal and apical defects suggestive of LAD territory scar.  No reversible ischemia. Myocardial thinning is noted in these areas.  LV Ejection Fraction: 50%.  LV Wall Motion:  Anteroseptal and apical hypokinesis  Mark Casino, MD, [facility] Board Certified in Nuclear Cardiology Attending Cardiologist [place]

## 2014-05-09 ENCOUNTER — Telehealth: Payer: Self-pay | Admitting: Interventional Cardiology

## 2014-05-09 NOTE — Telephone Encounter (Signed)
Message copied by Lamar Laundry on Wed May 09, 2014  4:46 PM ------      Message from: Daneen Schick      Created: Wed May 09, 2014  2:53 PM       It appears there has been an anterior MI. No way to determine age. Needs cath to further evaluate patency of LIMA to LAD graft. ------

## 2014-05-09 NOTE — Telephone Encounter (Signed)
returned pt call.pt aware of myoview results.It appears there has been an anterior MI. No way to determine age. Needs cath to further evaluate patency of LIMA to LAD graft.pt rqst copy be faxed to him [phone].pt rqst a call from [place] to discuss before proceeding. Message routed to [place]

## 2014-05-09 NOTE — Telephone Encounter (Signed)
Message copied by Lamar Laundry on Wed May 09, 2014  4:59 PM ------      Message from: Daneen Schick      Created: Wed May 09, 2014  2:53 PM       It appears there has been an anterior MI. No way to determine age. Needs cath to further evaluate patency of LIMA to LAD graft. ------

## 2014-05-09 NOTE — Telephone Encounter (Signed)
New message         Pt would like for Lattie Haw to give him a call / pt didn't disclose any info

## 2014-05-10 ENCOUNTER — Telehealth: Payer: Self-pay | Admitting: Interventional Cardiology

## 2014-05-10 DIAGNOSIS — R9439 Abnormal result of other cardiovascular function study: Secondary | ICD-10-CM

## 2014-05-10 NOTE — Telephone Encounter (Signed)
pt aware Cardiac cath sch on 9/11 @9am  with Dr.Smith. pt given verbal preprocedure instructions. written instructions mailed to pt.pt will come in on 9/9 to have preprocedure lab and go to GI for a cxr.pt thanked me for my help and verbalized understanding

## 2014-05-11 ENCOUNTER — Other Ambulatory Visit: Payer: Self-pay | Admitting: Interventional Cardiology

## 2014-05-11 DIAGNOSIS — I209 Angina pectoris, unspecified: Secondary | ICD-10-CM

## 2014-05-16 ENCOUNTER — Ambulatory Visit
Admission: RE | Admit: 2014-05-16 | Discharge: 2014-05-16 | Disposition: A | Discharge status: Home / Self Care | Payer: No Typology Code available for payment source | Source: Ambulatory Visit | Attending: Interventional Cardiology | Admitting: Interventional Cardiology

## 2014-05-16 ENCOUNTER — Encounter (HOSPITAL_COMMUNITY): Payer: Self-pay | Admitting: Pharmacy Technician

## 2014-05-16 ENCOUNTER — Other Ambulatory Visit (INDEPENDENT_AMBULATORY_CARE_PROVIDER_SITE_OTHER): Payer: No Typology Code available for payment source

## 2014-05-16 DIAGNOSIS — R9439 Abnormal result of other cardiovascular function study: Secondary | ICD-10-CM

## 2014-05-16 LAB — BASIC METABOLIC PANEL
BUN: 8 mg/dL (ref 6–23)
CHLORIDE: 104 meq/L (ref 96–112)
CO2: 27 meq/L (ref 19–32)
Calcium: 9.5 mg/dL (ref 8.4–10.5)
Creatinine, Ser: 1.1 mg/dL (ref 0.4–1.5)
GFR: 70.32 mL/min (ref >60.00)
Glucose, Bld: 85 mg/dL (ref 70–99)
POTASSIUM: 4.5 meq/L (ref 3.5–5.1)
SODIUM: 139 meq/L (ref 135–145)

## 2014-05-16 LAB — CBC WITH DIFFERENTIAL/PLATELET
BASOS PCT: 0.6 % (ref 0.0–3.0)
Basophils Absolute: 0 10*3/uL (ref 0.0–0.1)
EOS ABS: 0.1 10*3/uL (ref 0.0–0.7)
EOS PCT: 1.8 % (ref 0.0–5.0)
HCT: 47.9 % (ref 39.0–52.0)
HEMOGLOBIN: 16.6 g/dL (ref 13.0–17.0)
Lymphocytes Relative: 21.7 % (ref 12.0–46.0)
Lymphs Abs: 1.5 10*3/uL (ref 0.7–4.0)
MCHC: 34.6 g/dL (ref 30.0–36.0)
MCV: 98.4 fl (ref 78.0–100.0)
MONO ABS: 0.7 10*3/uL (ref 0.1–1.0)
Monocytes Relative: 9.3 % (ref 3.0–12.0)
NEUTROS ABS: 4.7 10*3/uL (ref 1.4–7.7)
NEUTROS PCT: 66.6 % (ref 43.0–77.0)
Platelets: 190 10*3/uL (ref 150.0–400.0)
RBC: 4.87 Mil/uL (ref 4.22–5.81)
RDW: 13.9 % (ref 11.5–15.5)
WBC: 7.1 10*3/uL (ref 4.0–10.5)

## 2014-05-16 LAB — PROTIME-INR
INR: 1.3 ratio — ABNORMAL HIGH (ref 0.8–1.0)
PROTHROMBIN TIME: 14.6 s — AB (ref 9.6–13.1)

## 2014-05-18 ENCOUNTER — Encounter (HOSPITAL_COMMUNITY)
Admission: RE | Disposition: A | Discharge status: Home / Self Care | Payer: No Typology Code available for payment source | Source: Ambulatory Visit | Attending: Interventional Cardiology

## 2014-05-18 ENCOUNTER — Ambulatory Visit (HOSPITAL_COMMUNITY)
Admission: RE | Admit: 2014-05-18 | Discharge: 2014-05-19 | Disposition: A | Discharge status: Home / Self Care | Payer: No Typology Code available for payment source | Source: Ambulatory Visit | Attending: Interventional Cardiology | Admitting: Interventional Cardiology

## 2014-05-18 DIAGNOSIS — I2510 Atherosclerotic heart disease of native coronary artery without angina pectoris: Secondary | ICD-10-CM | POA: Insufficient documentation

## 2014-05-18 DIAGNOSIS — Z951 Presence of aortocoronary bypass graft: Secondary | ICD-10-CM | POA: Diagnosis not present

## 2014-05-18 DIAGNOSIS — E785 Hyperlipidemia, unspecified: Secondary | ICD-10-CM | POA: Insufficient documentation

## 2014-05-18 DIAGNOSIS — I447 Left bundle-branch block, unspecified: Secondary | ICD-10-CM | POA: Diagnosis not present

## 2014-05-18 DIAGNOSIS — Z9861 Coronary angioplasty status: Secondary | ICD-10-CM | POA: Diagnosis not present

## 2014-05-18 DIAGNOSIS — F411 Generalized anxiety disorder: Secondary | ICD-10-CM | POA: Diagnosis not present

## 2014-05-18 DIAGNOSIS — Z7902 Long term (current) use of antithrombotics/antiplatelets: Secondary | ICD-10-CM | POA: Insufficient documentation

## 2014-05-18 DIAGNOSIS — I209 Angina pectoris, unspecified: Secondary | ICD-10-CM | POA: Diagnosis not present

## 2014-05-18 DIAGNOSIS — I208 Other forms of angina pectoris: Secondary | ICD-10-CM | POA: Diagnosis present

## 2014-05-18 DIAGNOSIS — Z7982 Long term (current) use of aspirin: Secondary | ICD-10-CM | POA: Insufficient documentation

## 2014-05-18 DIAGNOSIS — G4700 Insomnia, unspecified: Secondary | ICD-10-CM | POA: Insufficient documentation

## 2014-05-18 DIAGNOSIS — I25119 Atherosclerotic heart disease of native coronary artery with unspecified angina pectoris: Secondary | ICD-10-CM | POA: Diagnosis present

## 2014-05-18 DIAGNOSIS — Z87891 Personal history of nicotine dependence: Secondary | ICD-10-CM | POA: Diagnosis not present

## 2014-05-18 DIAGNOSIS — I10 Essential (primary) hypertension: Secondary | ICD-10-CM | POA: Diagnosis not present

## 2014-05-18 DIAGNOSIS — I25700 Atherosclerosis of coronary artery bypass graft(s), unspecified, with unstable angina pectoris: Secondary | ICD-10-CM

## 2014-05-18 HISTORY — PX: LEFT HEART CATHETERIZATION WITH CORONARY/GRAFT ANGIOGRAM: SHX5450

## 2014-05-18 LAB — POCT ACTIVATED CLOTTING TIME: ACTIVATED CLOTTING TIME: 523 s

## 2014-05-18 SURGERY — LEFT HEART CATHETERIZATION WITH CORONARY/GRAFT ANGIOGRAM
Anesthesia: LOCAL

## 2014-05-18 MED ORDER — HEPARIN (PORCINE) IN NACL 2-0.9 UNIT/ML-% IJ SOLN
INTRAMUSCULAR | Status: AC
  Filled 2014-05-18: qty 1000

## 2014-05-18 MED ORDER — ASPIRIN 81 MG PO CHEW: 81.0000 mg | CHEWABLE_TABLET | ORAL | Status: DC

## 2014-05-18 MED ORDER — ONDANSETRON HCL 4 MG/2ML IJ SOLN: 4.0000 mg | Freq: Four times a day (QID) | INTRAMUSCULAR | Status: DC | PRN

## 2014-05-18 MED ORDER — ACETAMINOPHEN 325 MG PO TABS: 650.0000 mg | ORAL_TABLET | ORAL | Status: DC | PRN

## 2014-05-18 MED ORDER — DIAZEPAM 5 MG PO TABS
5.0000 mg | ORAL_TABLET | Freq: Three times a day (TID) | ORAL | Status: DC | PRN
  Administered 2014-05-18: 5 mg via ORAL
  Filled 2014-05-18: qty 1

## 2014-05-18 MED ORDER — SODIUM CHLORIDE 0.9 % IV SOLN: INTRAVENOUS | Status: AC

## 2014-05-18 MED ORDER — SODIUM CHLORIDE 0.9 % IV SOLN: 250.0000 mL | INTRAVENOUS | Status: DC | PRN

## 2014-05-18 MED ORDER — LIDOCAINE HCL (PF) 1 % IJ SOLN
INTRAMUSCULAR | Status: AC
  Filled 2014-05-18: qty 30

## 2014-05-18 MED ORDER — NITROGLYCERIN 1 MG/10 ML FOR IR/CATH LAB
INTRA_ARTERIAL | Status: AC
  Filled 2014-05-18: qty 10

## 2014-05-18 MED ORDER — CLOPIDOGREL BISULFATE 75 MG PO TABS
ORAL_TABLET | ORAL | Status: AC
  Filled 2014-05-18: qty 2

## 2014-05-18 MED ORDER — ATORVASTATIN CALCIUM 80 MG PO TABS
80.0000 mg | ORAL_TABLET | Freq: Every day | ORAL | Status: DC
  Filled 2014-05-18 (×2): qty 1

## 2014-05-18 MED ORDER — CLOPIDOGREL BISULFATE 75 MG PO TABS
75.0000 mg | ORAL_TABLET | Freq: Every day | ORAL | Status: DC
  Administered 2014-05-19: 10:00:00 75 mg via ORAL
  Filled 2014-05-18: qty 1

## 2014-05-18 MED ORDER — FENTANYL CITRATE 0.05 MG/ML IJ SOLN
INTRAMUSCULAR | Status: AC
  Filled 2014-05-18: qty 2

## 2014-05-18 MED ORDER — MIDAZOLAM HCL 2 MG/2ML IJ SOLN
INTRAMUSCULAR | Status: AC
  Filled 2014-05-18: qty 2

## 2014-05-18 MED ORDER — SODIUM CHLORIDE 0.9 % IV SOLN
INTRAVENOUS | Status: DC
  Administered 2014-05-18: 08:00:00 via INTRAVENOUS

## 2014-05-18 MED ORDER — OXYCODONE-ACETAMINOPHEN 5-325 MG PO TABS
1.0000 | ORAL_TABLET | ORAL | Status: DC | PRN
  Administered 2014-05-19: 01:00:00 2 via ORAL
  Filled 2014-05-18: qty 2

## 2014-05-18 MED ORDER — SODIUM CHLORIDE 0.9 % IJ SOLN: 3.0000 mL | Freq: Two times a day (BID) | INTRAMUSCULAR | Status: DC

## 2014-05-18 MED ORDER — BIVALIRUDIN 250 MG IV SOLR
INTRAVENOUS | Status: AC
  Filled 2014-05-18: qty 250

## 2014-05-18 MED ORDER — ASPIRIN EC 325 MG PO TBEC
325.0000 mg | DELAYED_RELEASE_TABLET | Freq: Every day | ORAL | Status: DC
  Administered 2014-05-19: 325 mg via ORAL
  Filled 2014-05-18: qty 1

## 2014-05-18 MED ORDER — ADENOSINE 12 MG/4ML IV SOLN
16.0000 mL | Freq: Once | INTRAVENOUS | Status: AC
  Administered 2014-05-18: 48 mg via INTRAVENOUS
  Filled 2014-05-18: qty 16

## 2014-05-18 MED ORDER — SODIUM CHLORIDE 0.9 % IJ SOLN: 3.0000 mL | INTRAMUSCULAR | Status: DC | PRN

## 2014-05-18 NOTE — Progress Notes (Signed)
RESTING IN BED, NO ACUTE DISTRESS. NO CHEST PAIN, NO SOB. S BRADY PER MONITOR. CATH SITE REMAINS INTACT, NO BLEEDING, NO HEMATOMA. HOB TO 30, EATING WITHOUT COMPLICATION. EXPRESSED DESIRE TO GO HOME FOLLOWING REST TIME.

## 2014-05-18 NOTE — Progress Notes (Signed)
RECEIVED PT FROM CATH LAB IN NO ACUTE DISTRESS. NO CHEST PAIN , NO SOB. ALERT ORIENTED. CATH SITE SITE A R GROIN INTACT, NO BLEEDING, NO HEMATOMA. PEDAL PULSES +2. HOB FLAT, HAVE ADVISED PT TO REMAIN FLAT IN BED UNTIL INSTRUCTED.

## 2014-05-18 NOTE — H&P (Signed)
Mark Payne is an 58 y.o. male.        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 A recent EKG demonstrated a new left bundle. Fixed mid septal and apical perfusion defects were noted on myocardial perfusion imaging and classified as intermediate risk. No appreciable ischemia was noted. Because of ongoing ischemia and the development of a new left bundle angiography is being done to be certain that intense ischemia is not the source of the changes noted. Prior bypass surgery was performed in 2005 with LIMA to LAD, free radial to diagonal #1. This was performed in 2005. RCA had DES and 2011.  Past Medical History  Diagnosis Date  . LBBB (left bundle branch block) 04/02/2014  . Hyperlipidemia 04/02/2014  . Exertional angina 04/02/2014  . Essential hypertension 04/02/2014  . CAD (coronary artery disease) of artery bypass graft 04/02/2014    LIMA to LAD and free radial to diagonal #1 20075. PCI of RCA with DES May 2011  The most recent coronary angiogram in 2013 demonstrated a patent LIMA to LAD, patent free radial to diagonal, patent right coronary artery with a patent mid and distal stent. The first of perforator was threatened. The native LAD was subtotally occluded . It was felt that the anatomy was not significantly different than the prior cath in 2011.     No past surgical history on file.  No family history on file. Social History:  reports that he has quit smoking. He does not have any smokeless tobacco history on file. He reports that he drinks alcohol. He reports that he does not use illicit drugs.  Allergies: No Known Allergies  Medications Prior to Admission  Medication Sig Dispense Refill  . aspirin 81 MG tablet Take 81 mg by mouth daily.      . clopidogrel (PLAVIX) 75 MG tablet Take 75 mg by mouth daily.      . famotidine (PEPCID) 10 MG tablet Take 10 mg by  mouth daily as needed for heartburn.       . nebivolol (BYSTOLIC) 10 MG tablet Take 10 mg by mouth daily.      . nitroGLYCERIN (NITROSTAT) 0.4 MG SL tablet Place 1 tablet (0.4 mg total) under the tongue every 5 (five) minutes as needed for chest pain.  25 tablet  3  . rosuvastatin (CRESTOR) 10 MG tablet Take 10 mg by mouth daily.      . tamsulosin (FLOMAX) 0.4 MG CAPS capsule Take 0.4 mg by mouth every other day.      . valACYclovir (VALTREX) 1000 MG tablet Take 1,000 mg by mouth daily as needed (for oral symptoms).        Results for orders placed in visit on 05/16/14 (from the past 48 hour(s))  BASIC METABOLIC PANEL     Status: None   Collection Time    05/16/14 10:15 AM      Result Value Ref Range   Sodium 139  135 - 145 mEq/L   Potassium 4.5  3.5 - 5.1 mEq/L   Chloride 104  96 - 112 mEq/L   CO2 27  19 - 32 mEq/L   Glucose, Bld 85  70 - 99 mg/dL   BUN 8  6 - 23 mg/dL   Creatinine, Ser 1.1  0.4 - 1.5 mg/dL   Calcium 9.5  8.4 - 10.5 mg/dL   GFR 70.32  >60.00 mL/min  CBC WITH DIFFERENTIAL     Status: None   Collection Time    05/16/14 10:15 AM      Result Value Ref Range   WBC 7.1  4.0 - 10.5 K/uL   RBC 4.87  4.22 - 5.81 Mil/uL   Hemoglobin 16.6  13.0 - 17.0 g/dL   HCT 47.9  39.0 - 52.0 %   MCV 98.4  78.0 - 100.0 fl   MCHC 34.6  30.0 - 36.0 g/dL   RDW 13.9  11.5 - 15.5 %   Platelets 190.0  150.0 - 400.0 K/uL   Neutrophils Relative % 66.6  43.0 - 77.0 %   Lymphocytes Relative 21.7  12.0 - 46.0 %   Monocytes Relative 9.3  3.0 - 12.0 %   Eosinophils Relative 1.8  0.0 - 5.0 %   Basophils Relative 0.6  0.0 - 3.0 %   Neutro Abs 4.7  1.4 - 7.7 K/uL   Lymphs Abs 1.5  0.7 - 4.0 K/uL   Monocytes Absolute 0.7  0.1 - 1.0 K/uL   Eosinophils Absolute 0.1  0.0 - 0.7 K/uL   Basophils Absolute 0.0  0.0 - 0.1 K/uL  PROTIME-INR     Status: Abnormal   Collection Time    05/16/14 10:15 AM      Result Value Ref Range   INR 1.3 (*) 0.8 - 1.0 ratio   Prothrombin Time 14.6 (*) 9.6 - 13.1 sec     Dg Chest 2 View  05/16/2014   CLINICAL DATA:  Preop for cardiac catheterization  EXAM: CHEST  2 VIEW  COMPARISON:  06/18/2008  FINDINGS: There is no focal parenchymal opacity, pleural effusion, or pneumothorax. The heart and mediastinal contours are unremarkable. There is evidence of prior CABG.  The osseous structures are unremarkable.  IMPRESSION: No active cardiopulmonary disease.   Electronically Signed   By: Kathreen Devoid   On: 05/16/2014 13:24    ROS anxiety, exertional fatigue, insomnia, and stress related to his work  Blood pressure 142/87, pulse 54, temperature 98.4 F (36.9 C), temperature source Oral, resp. rate 17, height 5\' 8"  (1.727 m), weight 175 lb (79.379 kg), SpO2 98.00%. Physical Exam  Well-developed well-nourished and in no acute distress Skin color is normal No JVD or carotid bruits Lungs clear to auscultation and percussion Cardiac exam reveals no gallop, rub, click, or murmur Extremities reveal no edema. Pulses are 2+ in the right radial, absent left radial, and 2+ [place]dy femoral.  Abdomen is soft. There is no tenderness. Neurological exam is intact   ELECTROCARDIOGRAM: Normal sinus rhythm/bradycardia with left bundle branch block. The bundle branch block is new compared to prior tracings. This tracing was performed on 04/02/2014  Assessment/Plan 1. Known coronary artery disease with prior coronary bypass grafting with prior LIMA sequentially to mid and distal LAD and free radial to the large ramus intermedius/diagonal. The patient has been having class III angina pectoris with angina easily inducible by physical activity and occasional chest discomfort at rest requiring nitroglycerin. Recent myocardial perfusion study was intermediate risk. The studies being done to visualize the anatomy and identify the source of the patient's angina. 2. Hyperlipidemia  The patient has been having exertional and rest anginal symptoms. A myocardial perfusion study was intermediate  risk with an estimated ejection fraction of 51% and a fixed anterior septal and apical defect. No significant redistribution was noted. Because of continued symptoms angiography is felt indicated to define anatomy, rule out bypass graft daily, and to exclude obstructive disease  missed by myocardial perfusion imaging. The risk of the procedure including stroke, death, myocardial infarction, kidney failure, allergy, among others were discussed in detail except above the patient to  Sinclair Grooms 05/18/2014, 7:55 AM

## 2014-05-18 NOTE — Progress Notes (Signed)
Transferred from unit per stretcher in no acute distress. No chest pain, no sob. Alert oriented wife at bedside. Cath site to r groin intact, no bleeding, no hematoma. Pulses +2. Has voided x 1. Report called to 6c staff.

## 2014-05-18 NOTE — CV Procedure (Signed)
Left Heart Catheterization with Bypass, Coronary Angiography and PCI Report  Mark Payne  58 y.o.  male 11/22/1955  Procedure Date: 05/18/2014 Referring Physician: Caren Hazy Blenda Bridegroom, [name] Primary Cardiologist:  [facility] Blenda Bridegroom, [name]  INDICATIONS: Class III angina with intermediate risk myocardial perfusion study in a patient with prior coronary bypass grafting  PROCEDURE: 1. Left heart catheterization; 2. Left ventriculography; 3. Bypass graft angiography; 4. Coronary angiography; 5. FFR proximal RCA lesion; 6. DES RCA  CONSENT:  The risks, benefits, and details of the procedure were explained in detail to the patient. Risks including death, stroke, heart attack, kidney injury, allergy, limb ischemia, bleeding and radiation injury were discussed.  The patient verbalized understanding and wanted to proceed.  Informed written consent was obtained.  PROCEDURE TECHNIQUE:  After Xylocaine anesthesia a 5 French sheath was placed in the right femoral artery using the modified Seldinger technique. The radial approach was not used because the patient has a left internal mammary graft and the left radial artery was harvested for bypass surgery. Coronary angiography was done using a 5 F A2 multipurpose, JR 4, and internal mammary diagnostic catheters.  Left ventriculography was done using the 5 French A2 MP catheter and hand injection. Mid anterior wall severe hypokinesis was noted. Overall EF was 55%.  The proximal right coronary was noted to contain high-grade stenosis. Ischemia was not identified on myocardial perfusion imaging. Mid and distal stents in the right coronary widely patent. We performed FFR on the RCA lesion and identified that he was hemodynamically significant with a value of 0.68 after adenosine infusion.  Interpreting the hemodynamic and physiologic data in light of the patient's class III angina and stability of anatomy supplying the left coronary system, the evidence suggests  of the right coronary is been the lesion causing accelerated angina. Based upon those symptoms and FFR we performed PCI of the proximal RCA 90% stenosis. Predilatation was performed with a 15 x 2.75 mm [place] balloon throughout a segment that began at the mid stent and extended back to near the ostium of the right coronary. We then positioned and deployed a 3.0 x 28 mm Promus Premier drug-eluting stent at 15 atmospheres. Postdilatation was then performed with a 15 x 3.5 mm  balloon to 15 atmospheres throughout the newly stented segment. The stent slightly overlaps with the previously placed mid right coronary stent. Post procedure the right coronary was widely patent with 0% stenosis and TIMI grade 3 flow.  The patient has been on continuous Plavix therapy. An additional 150 mg was given. A bolus followed by an infusion a bivalirudin was started prior to FFR determination. An ACT greater than 300 was obtained.  Post procedure Angio-Seal was used to obtain hemostasis in the right femoral.     CONTRAST:  Total of 220 cc.  COMPLICATIONS:  None   HEMODYNAMICS:  Aortic pressure 114/59 mmHg; LV pressure 119/0 mmHg; LVEDP 4 mmHg  ANGIOGRAPHIC DATA:   The left main coronary artery is widely patent with distal 20% narrowing.  The left anterior descending artery is severely diseased with 95% proximal stenosis followed by a patent Taxus stent followed by an 80% stenosis before the origin of a large branching septal perforator. The LAD then is subtotally occluded. This appears his been present at least since 2013 at the time of the most recent coronary angiogram. Competitive flow in the diagonal and mid LAD are noted..  The left circumflex artery is widely patent with proximal eccentric 50% narrowing.  This is not significantly different than 2013..  The right coronary artery is patent but contains segmental 90% proximal stenosis with a hazy appearance and eccentricity. The mid and distal stents are widely  patent. The PDA is widely patent. The lesion in the proximal RCA is new compared to 2013 .   BYPASS GRAFT ANGIOGRAPHY:  LIMA to the LAD: this graft is widely patent with a side to side followed by an end-to-side anastomosis to the mid and distal LAD. No changes noted compared to the prior angiogram of 2013.  Free radial artery graft to the diagonal: This graft is widely patent. No obstruction is noted in the graft or the native vessel  PCI RESULTS: 90+ percent proximal RCA stenosis with a measured FFR 0.68 was reduced to 0% with TIMI grade 3 flow following angioplasty and DES implantation. Postdilatation was 3.5 mm diameter at high pressure.  LEFT VENTRICULOGRAM:  Left ventricular angiogram was done in the 30 RAO projection and revealed mid anterior wall motion abnormality. EF 55% with normal hemodynamics.   IMPRESSIONS:  1. High-grade obstruction in the proximal right coronary documented to be hemodynamically significant and new since the patient's prior angiogram in 2013. 2. Successful angioplasty and stenting of the mid right coronary from 90+ percent stenosis to 0% with TIMI grade 3 flow using DES postdilated to 3.5 mm. 3. Widely patent native circumflex 4. Widely patent left main and highly diseased proximal and mid LAD, unchanged from prior angiography in 2013 5. Widely patent LIMA to LAD and widely patent free radial to the first diagonal. 6. Widely patent mid and distal right coronary stents and been previously placed in 2011 to 7. Overall normal LV function with mid anterior wall motion abnormality 8. Left bundle branch block   RECOMMENDATION:  Aspirin and Plavix will be continued P2 Y12 assay will be performed. He should be eligible for discharge in a.m.

## 2014-05-19 ENCOUNTER — Encounter (HOSPITAL_COMMUNITY): Payer: Self-pay | Admitting: *Deleted

## 2014-05-19 DIAGNOSIS — E785 Hyperlipidemia, unspecified: Secondary | ICD-10-CM

## 2014-05-19 DIAGNOSIS — I447 Left bundle-branch block, unspecified: Secondary | ICD-10-CM

## 2014-05-19 DIAGNOSIS — I10 Essential (primary) hypertension: Secondary | ICD-10-CM

## 2014-05-19 DIAGNOSIS — I25810 Atherosclerosis of coronary artery bypass graft(s) without angina pectoris: Secondary | ICD-10-CM

## 2014-05-19 DIAGNOSIS — I200 Unstable angina: Secondary | ICD-10-CM

## 2014-05-19 DIAGNOSIS — I2510 Atherosclerotic heart disease of native coronary artery without angina pectoris: Secondary | ICD-10-CM | POA: Diagnosis not present

## 2014-05-19 DIAGNOSIS — I209 Angina pectoris, unspecified: Secondary | ICD-10-CM

## 2014-05-19 LAB — BASIC METABOLIC PANEL
ANION GAP: 10 (ref 5–15)
BUN: 10 mg/dL (ref 6–23)
CHLORIDE: 106 meq/L (ref 96–112)
CO2: 26 mEq/L (ref 19–32)
Calcium: 9.1 mg/dL (ref 8.4–10.5)
Creatinine, Ser: 1 mg/dL (ref 0.50–1.35)
GFR calc non Af Amer: 82 mL/min — ABNORMAL LOW (ref >90)
Glucose, Bld: 88 mg/dL (ref 70–99)
POTASSIUM: 4.2 meq/L (ref 3.7–5.3)
SODIUM: 142 meq/L (ref 137–147)

## 2014-05-19 LAB — CBC
HCT: 44.9 % (ref 39.0–52.0)
Hemoglobin: 15.8 g/dL (ref 13.0–17.0)
MCH: 33.7 pg (ref 26.0–34.0)
MCHC: 35.2 g/dL (ref 30.0–36.0)
MCV: 95.7 fL (ref 78.0–100.0)
PLATELETS: 164 10*3/uL (ref 150–400)
RBC: 4.69 MIL/uL (ref 4.22–5.81)
RDW: 13.1 % (ref 11.5–15.5)
WBC: 7 10*3/uL (ref 4.0–10.5)

## 2014-05-19 MED ORDER — NEBIVOLOL HCL 5 MG PO TABS
5.0000 mg | ORAL_TABLET | Freq: Every day | ORAL | Status: DC
Start: 2014-05-19 — End: 2014-05-25

## 2014-05-19 MED ORDER — ACETAMINOPHEN 325 MG PO TABS: 650.0000 mg | ORAL_TABLET | ORAL | Status: DC | PRN

## 2014-05-19 MED ORDER — INFLUENZA VAC SPLIT QUAD 0.5 ML IM SUSY
0.5000 mL | PREFILLED_SYRINGE | Freq: Once | INTRAMUSCULAR | Status: AC
  Administered 2014-05-19: 10:00:00 0.5 mL via INTRAMUSCULAR
  Filled 2014-05-19 (×2): qty 0.5

## 2014-05-19 NOTE — Progress Notes (Signed)
Phase I Cardiac Rehab  Pt has been up walking independently, awaiting d/c.  Reviewed diet, exercise, restrictions, stent card/book, and when to call 911/MD.  Pt was already able to teach back diet and exercise guidelines.  Pt voiced understanding.  Pt declined Phase II Cardiac Rehab as he will continue to exercise on his own at home. Alberteen Sam, MA, ACSM RCEP

## 2014-05-19 NOTE — Discharge Instructions (Signed)
Coronary Angiogram with Stent °Coronary angiography with stent placement is a procedure to widen or open a narrow blood vessel of the heart (coronary artery). When a coronary artery becomes partially blocked, it decreases blood flow to that area. This may lead to chest pain or a heart attack (myocardial infarction). Arteries may become blocked by cholesterol buildup (plaque) in the lining or wall.  °A stent is a small piece of metal that looks like a mesh or a spring. Stent placement may be done right after a coronary angiography in which a blocked artery is found or as a treatment for a heart attack.  °LET YOUR HEALTH CARE PROVIDER KNOW ABOUT: °· Any allergies you have.   °· All medicines you are taking, including vitamins, herbs, eye drops, creams, and over-the-counter medicines.   °· Previous problems you or members of your family have had with the use of anesthetics.   °· Any blood disorders you have.   °· Previous surgeries you have had.   °· Medical conditions you have. °RISKS AND COMPLICATIONS °Generally, coronary angiography with stent is a safe procedure. However, problems can occur and include: °· Damage to the heart or its blood vessels.   °· A return of blockage.   °· Bleeding, infection, or bruising at the insertion site.   °· A collection of blood under the skin (hematoma) at the insertion site. °· Blood clot in another part of the body.   °· Kidney injury.   °· Allergic reaction to the dye or contrast used.   °· Bleeding into the abdomen (retroperitoneal bleeding). °BEFORE THE PROCEDURE °· Do not eat or drink anything after midnight on the night before the procedure or as directed by your health care provider.  °· Ask your health care provider about changing or stopping your regular medicines. This is especially important if you are taking diabetes medicines or blood thinners. °· Your health care provider will make sure you understand the procedure as well as the risks and potential problems  associated with the procedure.   °PROCEDURE °· You may be given a medicine to help you relax before and during the procedure (sedative). This medicine will be given through an IV tube that is put into one of your veins.   °· The area where the catheter will be inserted will be shaved and cleaned. This is usually done in the groin but may be done in the fold of your arm (near your elbow) or in the wrist.    °· A medicine will be given to numb the area where the catheter will be inserted (local anesthetic).   °· The catheter will be inserted into an artery using a guide wire. A type of X-ray (fluoroscopy) will be used to help guide the catheter to the opening of the blocked artery.   °· A dye will then be injected into the catheter, and X-rays will be taken. The dye will help to show where any narrowing or blockages are located in the heart arteries.   °· A tiny wire will be guided to the blocked spot, and a balloon will be inflated to make the artery wider. The stent will be expanded and will crush the plaque into the wall of the vessel. The stent will hold the area open like a scaffolding and improve the blood flow.   °· Sometimes the artery may be made wider using a laser or other tools to remove plaque.   °· When the blood flow is better, the catheter will be removed. The lining of the artery will grow over the stent, which stays where it was placed.   °  AFTER THE PROCEDURE °· If the procedure is done through the leg, you will be kept in bed lying flat for about 6 hours. You will be instructed to not bend or cross your legs.   °· The insertion site will be checked frequently.   °· The pulse in your feet or wrist will be checked frequently.   °· Additional blood tests, X-rays, and electrocardiography may be done. °Document Released: 02/28/2003 Document Revised: 01/08/2014 Document Reviewed: 03/02/2013 °Exit[employer]® Patient Information ©2015 [employer]. This information is not intended to replace advice given to you  by your health care provider. Make sure you discuss any questions you have with your health care provider. ° °

## 2014-05-19 NOTE — Progress Notes (Signed)
  Patient Name: Mark Payne Date of Encounter: 05/19/2014  Active Problems:   Other and unspecified angina pectoris   Length of Stay: 1  SUBJECTIVE  No angina, no groin complaints.  CURRENT MEDS . aspirin EC  325 mg Oral Daily  . atorvastatin  80 mg Oral q1800  . clopidogrel  75 mg Oral Q breakfast  . Influenza vac split quadrivalent PF  0.5 mL Intramuscular Once    OBJECTIVE   Intake/Output Summary (Last 24 hours) at 05/19/14 0852 Last data filed at 05/19/14 0814  Gross per 24 hour  Intake   1410 ml  Output   1050 ml  Net    360 ml   Filed Weights   05/18/14 0730  Weight: 175 lb (79.379 kg)    PHYSICAL EXAM Filed Vitals:   05/18/14 2100 05/19/14 0013 05/19/14 0510 05/19/14 0811  BP: 122/43 135/70 131/65 141/96  Pulse:  58 43 51  Temp:  98.3 F (36.8 C) 97.9 F (36.6 C) 97.7 F (36.5 C)  TempSrc:  Oral Oral Oral  Resp:  18 20 18   Height:      Weight:      SpO2:  98% 98% 98%   General: Alert, oriented x3, no distress Head: no evidence of trauma, PERRL, EOMI, no exophtalmos or lid lag, no myxedema, no xanthelasma; normal ears, nose and oropharynx Neck: normal jugular venous pulsations and no hepatojugular reflux; brisk carotid pulses without delay and no carotid bruits Chest: clear to auscultation, no signs of consolidation by percussion or palpation, normal fremitus, symmetrical and full respiratory excursions Cardiovascular: normal position and quality of the apical impulse, regular rhythm, normal first and second heart sounds, no rubs or gallops, no murmur Abdomen: no tenderness or distention, no masses by palpation, no abnormal pulsatility or arterial bruits, normal bowel sounds, no hepatosplenomegaly Extremities: no clubbing, cyanosis or edema; 2+ radial, ulnar and brachial pulses bilaterally; 2+ right femoral, posterior tibial and dorsalis pedis pulses; 2+ left femoral, posterior tibial and dorsalis pedis pulses; no subclavian or femoral bruits Healthy  groin site - no hematoma, no bleeding Neurological: grossly nonfocal  LABS  CBC  Recent Labs  05/16/14 1015 05/19/14 0502  WBC 7.1 7.0  NEUTROABS 4.7  --   HGB 16.6 15.8  HCT 47.9 44.9  MCV 98.4 95.7  PLT 190.0 696   Basic Metabolic Panel  Recent Labs  05/16/14 1015 05/19/14 0502  NA 139 142  K 4.5 4.2  CL 104 106  CO2 27 26  GLUCOSE 85 88  BUN 8 10  CREATININE 1.1 1.00  CALCIUM 9.5 9.1    Radiology Studies Imaging results have been reviewed and No results found.  TELE NSR/ sinus bradycardia  ECG NSR, LBBB  ASSESSMENT AND PLAN S/P proximal RCA DES for 90% lesion, patent old stents and patent grafts to LAD system Healthy groin site. Heart rate around 50 at rest, 40s during sleep, but promptly increases to 65-70 just by sitting up at side of bed. Restart Bystolic at 5 mg daily Understands critical role of dual antiplatelet therapy   Sanda Klein, MD, [facility] [phone] office [phone] pager 05/19/2014 8:52 AM

## 2014-05-19 NOTE — Discharge Summary (Signed)
Patient ID: Mark Payne,  MRN: [id number], DOB/AGE: 01/25/1956 58 y.o.  Admit date: 05/18/2014 Discharge date: 05/19/2014  Primary Care Provider: Tivis Ringer, MD Primary Cardiologist: Dr Tamala Julian  Discharge Diagnoses Principal Problem:   Exertional angina Active Problems:   LBBB- new   CAD- CABG x2 '05, RCA DES 2011, new DES 05/18/14   Hyperlipidemia   Essential hypertension    Procedures: Cath/PCI 05/18/14   Hospital Course:  Dr. Ambrose Pancoast has had increasing exertional chest discomfort and dyspnea. A recent EKG demonstrated a new left bundle. Fixed mid septal and apical perfusion defects were noted on myocardial perfusion imaging and classified as intermediate risk. No appreciable ischemia was noted. Because of ongoing ischemia and the development of a new left bundle angiography is being done to be certain that intense ischemia is not the source of the changes noted. Prior bypass surgery was performed in 2005 with LIMA to LAD, free radial to diagonal #1. This was performed in 2005. He had an RCA  DES in 2011. The pt had cath/PCI with a new DES placed in the RCA (new site). He tolerated this well and Dr Sallyanne Kuster feels he can be discharged the morning of the 12th. His Bystolic was cut back to 5 mg daily at discharge. He will follow up with Dr Tamala Julian.    Discharge Vitals:  Blood pressure 141/96, pulse 51, temperature 97.7 F (36.5 C), temperature source Oral, resp. rate 18, height 5\' 8"  (1.727 m), weight 175 lb (79.379 kg), SpO2 98.00%.    Labs: Results for orders placed during the hospital encounter of 05/18/14 (from the past 24 hour(s))  POCT ACTIVATED CLOTTING TIME     Status: None   Collection Time    05/18/14 10:17 AM      Result Value Ref Range   Activated Clotting Time 144    BASIC METABOLIC PANEL     Status: Abnormal   Collection Time    05/19/14  5:02 AM      Result Value Ref Range   Sodium 142  137 - 147 mEq/L   Potassium 4.2  3.7 - 5.3 mEq/L   Chloride 106   96 - 112 mEq/L   CO2 26  19 - 32 mEq/L   Glucose, Bld 88  70 - 99 mg/dL   BUN 10  6 - 23 mg/dL   Creatinine, Ser 1.00  0.50 - 1.35 mg/dL   Calcium 9.1  8.4 - 10.5 mg/dL   GFR calc non Af Amer 82 (*) >90 mL/min   GFR calc Af Amer >90  >90 mL/min   Anion gap 10  5 - 15  CBC     Status: None   Collection Time    05/19/14  5:02 AM      Result Value Ref Range   WBC 7.0  4.0 - 10.5 K/uL   RBC 4.69  4.22 - 5.81 MIL/uL   Hemoglobin 15.8  13.0 - 17.0 g/dL   HCT 44.9  39.0 - 52.0 %   MCV 95.7  78.0 - 100.0 fL   MCH 33.7  26.0 - 34.0 pg   MCHC 35.2  30.0 - 36.0 g/dL   RDW 13.1  11.5 - 15.5 %   Platelets 164  150 - 400 K/uL    Disposition:  Follow-up Information   Follow up with Sinclair Grooms, MD. (office will call you)    Specialty:  Cardiology   Contact information:   [address]  [place] [address] [phone]       Discharge Medications:    Medication List         acetaminophen 325 MG tablet  Commonly known as:  TYLENOL  Take 2 tablets (650 mg total) by mouth every 4 (four) hours as needed for headache or mild pain.     aspirin 81 MG tablet  Take 81 mg by mouth daily.     clopidogrel 75 MG tablet  Commonly known as:  PLAVIX  Take 75 mg by mouth daily.     famotidine 10 MG tablet  Commonly known as:  PEPCID  Take 10 mg by mouth daily as needed for heartburn.     nebivolol 5 MG tablet  Commonly known as:  BYSTOLIC  Take 1 tablet (5 mg total) by mouth daily.     nitroGLYCERIN 0.4 MG SL tablet  Commonly known as:  NITROSTAT  Place 1 tablet (0.4 mg total) under the tongue every 5 (five) minutes as needed for chest pain.     rosuvastatin 10 MG tablet  Commonly known as:  CRESTOR  Take 10 mg by mouth daily.     tamsulosin 0.4 MG Caps capsule  Commonly known as:  FLOMAX  Take 0.4 mg by mouth every other day.     valACYclovir 1000 MG tablet  Commonly known as:  VALTREX  Take 1,000 mg by mouth daily as needed (for oral symptoms).          Duration of Discharge Encounter: Greater than 30 minutes including physician time.  Angelena Form PA-C 05/19/2014 9:05 AM

## 2014-05-21 MED FILL — Sodium Chloride IV Soln 0.9%: INTRAVENOUS | Qty: 50 | Status: AC

## 2014-05-25 ENCOUNTER — Encounter: Payer: Self-pay | Admitting: Interventional Cardiology

## 2014-05-25 ENCOUNTER — Ambulatory Visit (INDEPENDENT_AMBULATORY_CARE_PROVIDER_SITE_OTHER): Payer: No Typology Code available for payment source | Admitting: Interventional Cardiology

## 2014-05-25 VITALS — BP 112/90 | HR 60 | Ht 68.0 in | Wt 183.0 lb

## 2014-05-25 DIAGNOSIS — I10 Essential (primary) hypertension: Secondary | ICD-10-CM

## 2014-05-25 DIAGNOSIS — E785 Hyperlipidemia, unspecified: Secondary | ICD-10-CM

## 2014-05-25 DIAGNOSIS — I25709 Atherosclerosis of coronary artery bypass graft(s), unspecified, with unspecified angina pectoris: Secondary | ICD-10-CM

## 2014-05-25 DIAGNOSIS — I25810 Atherosclerosis of coronary artery bypass graft(s) without angina pectoris: Secondary | ICD-10-CM

## 2014-05-25 DIAGNOSIS — I209 Angina pectoris, unspecified: Secondary | ICD-10-CM

## 2014-05-25 MED ORDER — ROSUVASTATIN CALCIUM 20 MG PO TABS: 20.0000 mg | ORAL_TABLET | Freq: Every day | ORAL | Status: DC

## 2014-05-25 MED ORDER — CLOPIDOGREL BISULFATE 75 MG PO TABS: 75.0000 mg | ORAL_TABLET | Freq: Every day | ORAL | Status: DC

## 2014-05-25 MED ORDER — NEBIVOLOL HCL 10 MG PO TABS: 5.0000 mg | ORAL_TABLET | Freq: Every day | ORAL | Status: DC

## 2014-05-25 NOTE — Progress Notes (Signed)
Patient ID: Mark Payne, male   DOB: 1956/06/20, 58 y.o.   MRN: [id number]    [address] [place], [place]  [address] Phone: [phone] Fax:  [phone]  Date:  05/25/2014   ID:  Mark Payne, DOB 1956-03-18, MRN [id number]  PCP:  Tivis Ringer, MD   ASSESSMENT:  1. Coronary atherosclerosis with angina, resolved after RCA stent 2. Hyperlipidemia 3. Hypertension  PLAN:  1. Crestor 40 mg per day for one month. Then decreased back to 20 mg daily 2. Increase physical activity as tolerated 3. Call if symptoms otherwise return for followup in 6 months 4. Indefinite dual antiplatelet therapy   SUBJECTIVE: Mark Payne is a 58 y.o. male was doing well after right femoral approach for right coronary intervention approximately 10 days ago. He states the nagging left chest pressure has not recurred since that time. He still has dyspnea with exertion this but feels this could be related to deconditioning. He has not needed to use nitroglycerin. No groin complication.   Wt Readings from Last 3 Encounters:  05/25/14 183 lb (83.008 kg)  05/18/14 175 lb (79.379 kg)  05/18/14 175 lb (79.379 kg)     Past Medical History  Diagnosis Date  . LBBB (left bundle branch block) 04/02/2014  . Hyperlipidemia 04/02/2014  . Exertional angina 04/02/2014  . Essential hypertension 04/02/2014  . CAD (coronary artery disease) of artery bypass graft 04/02/2014    LIMA to LAD and free radial to diagonal #1 [id number]. PCI of RCA with DES May 2011  The most recent coronary angiogram in 2013 demonstrated a patent LIMA to LAD, patent free radial to diagonal, patent right coronary artery with a patent mid and distal stent. The first of perforator was threatened. The native LAD was subtotally occluded . It was felt that the anatomy was not significantly different than the prior cath in 2011.     Current Outpatient Prescriptions  Medication Sig Dispense Refill  . aspirin 81 MG tablet Take 81  mg by mouth daily.      . clopidogrel (PLAVIX) 75 MG tablet Take 75 mg by mouth daily.      . famotidine (PEPCID) 10 MG tablet Take 10 mg by mouth daily as needed for heartburn.       . nebivolol (BYSTOLIC) 5 MG tablet Take 1 tablet (5 mg total) by mouth daily.  30 tablet  11  . rosuvastatin (CRESTOR) 10 MG tablet Take 10 mg by mouth daily.      . tamsulosin (FLOMAX) 0.4 MG CAPS capsule Take 0.4 mg by mouth as needed.       Marland Kitchen acetaminophen (TYLENOL) 325 MG tablet Take 2 tablets (650 mg total) by mouth every 4 (four) hours as needed for headache or mild pain.      . nitroGLYCERIN (NITROSTAT) 0.4 MG SL tablet Place 1 tablet (0.4 mg total) under the tongue every 5 (five) minutes as needed for chest pain.  25 tablet  3   No current facility-administered medications for this visit.    Allergies:   No Known Allergies  Social History:  The patient  reports that he has quit smoking. He does not have any smokeless tobacco history on file. He reports that he drinks alcohol. He reports that he does not use illicit drugs.   ROS:  Please see the history of present illness.   Denies orthopnea, PND, palpitations, claudication, and syncope   All other systems reviewed and negative.  OBJECTIVE: VS:  BP 112/90  Pulse 60  Ht 5\' 8"  (1.727 m)  Wt 183 lb (83.008 kg)  BMI 27.83 kg/m2 Well nourished, well developed, in no acute distress, appears healthy HEENT: normal Neck: JVD flat. Carotid bruit absent  Cardiac:  normal S1, S2; RRR; no murmur Lungs:  clear to auscultation bilaterally, no wheezing, rhonchi or rales Abd: soft, nontender, no hepatomegaly Ext: Edema absent. Pulses 2+ and symmetric Skin: warm and dry Neuro:  CNs 2-12 intact, no focal abnormalities noted  EKG:  Not repeated       Signed, Illene Labrador III, MD 05/25/2014 9:37 AM

## 2014-05-25 NOTE — Patient Instructions (Signed)
Your physician has recommended you make the following change in your medication:  1) INCREASE Crestor to 40mg  daily for 1 month, then back to 20mg  daily  Take all other medications as prescribed  Your physician wants you to follow-up in: 6 months with Dr.Smith You will receive a reminder letter in the mail two months in advance. If you don't receive a letter, please call our office to schedule the follow-up appointment.

## 2014-06-26 ENCOUNTER — Other Ambulatory Visit (INDEPENDENT_AMBULATORY_CARE_PROVIDER_SITE_OTHER): Payer: No Typology Code available for payment source | Admitting: *Deleted

## 2014-06-26 ENCOUNTER — Ambulatory Visit (INDEPENDENT_AMBULATORY_CARE_PROVIDER_SITE_OTHER): Payer: No Typology Code available for payment source

## 2014-06-26 ENCOUNTER — Telehealth: Payer: Self-pay | Admitting: Interventional Cardiology

## 2014-06-26 DIAGNOSIS — I447 Left bundle-branch block, unspecified: Secondary | ICD-10-CM

## 2014-06-26 DIAGNOSIS — I25709 Atherosclerosis of coronary artery bypass graft(s), unspecified, with unspecified angina pectoris: Secondary | ICD-10-CM

## 2014-06-26 DIAGNOSIS — I10 Essential (primary) hypertension: Secondary | ICD-10-CM

## 2014-06-26 DIAGNOSIS — I208 Other forms of angina pectoris: Secondary | ICD-10-CM

## 2014-06-26 DIAGNOSIS — R079 Chest pain, unspecified: Secondary | ICD-10-CM

## 2014-06-26 DIAGNOSIS — R0789 Other chest pain: Secondary | ICD-10-CM

## 2014-06-26 LAB — TROPONIN I

## 2014-06-26 NOTE — Telephone Encounter (Signed)
Pt spoke with Dr.Smith directly. Pt will come in today around 2 pm for an Ekg and troponin lab

## 2014-06-26 NOTE — Telephone Encounter (Signed)
New message      Pt states he is not feeling well and want to come by for an EKG.  Is this ok?

## 2014-06-26 NOTE — Telephone Encounter (Signed)
Lab called with stat troponin results, copy taken to Dr Vaughan Sine.

## 2014-06-26 NOTE — Telephone Encounter (Signed)
pt aware Troponin stat lab reviewed by Dr.Smith.pt adv troponin is negative. pt adv that pt chest discomfort is not likely cardiac related and Dr.Smith believes its musculoskeletal. pt wasd thamkful and verbalized understanding.

## 2014-06-29 NOTE — Telephone Encounter (Signed)
I spoke to Dr. Ambrose Pancoast, reviewed the EKG which showed left bundle and sinus bradycardia (unchanged from prior tracings). The chest pain is atypical. Dislocated and a single spot in the left lateral chest near the anterior axillary line. He states it feels as though someone has their from pressing on the spot. The troponin that we drew was unremarkable. At this point I do not believe there is any significant change in his clinical status. The right coronary stent is not being threatened as there would be significant discomfort in that vessel were occluded. We will continue to follow clinically. No limitations on activity other than as controlled by symptoms.

## 2014-08-16 ENCOUNTER — Encounter (HOSPITAL_COMMUNITY): Payer: Self-pay | Admitting: Interventional Cardiology

## 2014-09-03 ENCOUNTER — Telehealth: Payer: Self-pay | Admitting: *Deleted

## 2014-09-03 NOTE — Telephone Encounter (Signed)
Prior Authorization for Crestor denied, patient must have tried and failed two of the following: atorvastatin, lovastatin, pravastatin, or simvastatin.

## 2014-09-05 NOTE — Telephone Encounter (Signed)
Refer to [facility] for PSK-9

## 2014-09-06 NOTE — Telephone Encounter (Signed)
Pt aware that Crestor is not covered by his insurance. He is switching insurance companies effective Jan 1,2016 from [employer] to [employer]. He does believe it will be covered under his new ins plan. He will continue taking Crestor with no interruption in his statin therapy. He will  call back if we can be of further assistance.

## 2014-09-06 NOTE — Telephone Encounter (Signed)
Pt will not qualify for PCSK-9 without failure of Crestor and atorvastatin in combination with Zetia.  No documented lipid panel in chart.  Would change to Lipitor 80mg  daily.  If pt doesn't tolerate then would have an argument for Crestor given need for high intensity statin.   Would suggest a lipid panel either here or with PCP to document effectiveness.

## 2014-10-29 ENCOUNTER — Telehealth: Payer: Self-pay | Admitting: Interventional Cardiology

## 2014-10-29 DIAGNOSIS — R0609 Other forms of dyspnea: Secondary | ICD-10-CM

## 2014-10-29 DIAGNOSIS — R0600 Dyspnea, unspecified: Secondary | ICD-10-CM

## 2014-10-29 DIAGNOSIS — R079 Chest pain, unspecified: Secondary | ICD-10-CM

## 2014-10-29 NOTE — Telephone Encounter (Signed)
Lmom. Per Dr.Smith.Schedule stress Cardiolite as soon as possible in the [facility]. A scheduler from our office will call him to schedule

## 2014-10-29 NOTE — Telephone Encounter (Signed)
New message    Patient calling   Pt C/O of Chest Pain: STAT if CP now or developed within 24 hours  1. Are you having CP right now? Patient states chest pressure   2. Are you experiencing any other symptoms (ex. SOB, nausea, vomiting, sweating)? Sob occasionally   3. How long have you been experiencing CP? Pressure about 2 weeks   4. Is your CP continuous or coming and going? Comes and goes   5. Have you taken Nitroglycerin?  two week ago did not get any relief.    Patient asking for a stress test.  ?

## 2014-10-29 NOTE — Telephone Encounter (Signed)
Schedule stress Cardiolite as soon as possible in the [place]. office

## 2014-10-29 NOTE — Telephone Encounter (Signed)
Pt sts that for the last 2-3 weeks he has on going chest pressure and exertional dyspnea similar to past symptoms. He is exhausted all the time. He was running 2-3 miles regularly and has not been able to do that lately. He has been taking it easy the last week because he is concerned about his symptoms. He thinks he needs to have a nuclear stress test to evaluate symptoms.fwd to Dr.Smith to adv

## 2014-10-30 NOTE — Telephone Encounter (Signed)
Pt given myoview pretest instruction with verbal understanding. -hold Bystolic the morning of -nothing to eat of drink 4 hour prior -no caffeine,chocolate, decaff 12 hrs prior -dress in gym clothes  Pt verbalized understanding.

## 2014-11-12 ENCOUNTER — Ambulatory Visit (HOSPITAL_COMMUNITY): Payer: 59 | Attending: Internal Medicine | Admitting: Radiology

## 2014-11-12 DIAGNOSIS — R079 Chest pain, unspecified: Secondary | ICD-10-CM | POA: Insufficient documentation

## 2014-11-12 DIAGNOSIS — R0609 Other forms of dyspnea: Secondary | ICD-10-CM

## 2014-11-12 DIAGNOSIS — R0600 Dyspnea, unspecified: Secondary | ICD-10-CM

## 2014-11-12 MED ORDER — TECHNETIUM TC 99M SESTAMIBI GENERIC - CARDIOLITE
11.0000 | Freq: Once | INTRAVENOUS | Status: AC | PRN
  Administered 2014-11-12: 11 via INTRAVENOUS

## 2014-11-12 MED ORDER — TECHNETIUM TC 99M SESTAMIBI GENERIC - CARDIOLITE
33.0000 | Freq: Once | INTRAVENOUS | Status: AC | PRN
  Administered 2014-11-12: 33 via INTRAVENOUS

## 2014-11-12 MED ORDER — ADENOSINE (DIAGNOSTIC) 3 MG/ML IV SOLN
0.5600 mg/kg | Freq: Once | INTRAVENOUS | Status: AC
  Administered 2014-11-12: 48 mg via INTRAVENOUS

## 2014-11-12 NOTE — Progress Notes (Signed)
[facility] SITE [address] NUCLEAR MED [address] [place], [place] [address] [phone]    Cardiology Nuclear Med Study  Mark Payne is a 59 y.o. male     MRN : [id number]     DOB: Jan 21, 1956  Procedure Date: 11/12/2014  Nuclear Med Background Indication for Stress Test:  Evaluation for Ischemia, Graft Patency and Stent Patency History:  CAD, MPI 2015 (scar) EF 50% Cardiac Risk Factors: Family History - CAD, History of Smoking, Hypertension and LBBB  Symptoms:  Chest Pain and DOE   Nuclear Pre-Procedure Caffeine/Decaff Intake:  None> 12 hrs NPO After: 10:00pm   Lungs:  clear O2 Sat: 97% on room air. IV 0.9% NS with Angio Cath:  22g  IV Site: R Hand x 1, tolerated well IV Started by:  Irven Baltimore, RN  Chest Size (in):  42 Cup Size: n/a  Height: 5\' 8"  (1.727 m)  Weight:  189 lb (85.73 kg)  BMI:  Body mass index is 28.74 kg/(m^2). Tech Comments:  Patient held Bystolic x 24 hrs. Irven Baltimore, RN.    Nuclear Med Study 1 or 2 day study: 1 day  Stress Test Type:  Adenosine  Reading MD: N/A  Order Authorizing Provider:  Daneen Schick, III, MD  Resting Radionuclide: Technetium 57m Sestamibi  Resting Radionuclide Dose: 11.0 mCi   Stress Radionuclide:  Technetium 62m Sestamibi  Stress Radionuclide Dose: 33.0 mCi           Stress Protocol Rest HR: 43 Stress HR: 65  Rest BP: 1374/87 Stress BP: 153/86  Exercise Time (min): n/a METS: n/a   Predicted Max HR: 163 bpm % Max HR: 39.88 bpm Rate Pressure Product: 9945   Dose of Adenosine (mg):  48.1 mg Dose of Lexiscan: n/a mg  Dose of Atropine (mg): n/a Dose of Dobutamine: n/a mcg/kg/min (at max HR)  Stress Test Technologist: Glade Lloyd, BS-ES  Nuclear Technologist:  Earl Many, CNMT     Rest Procedure:  Myocardial perfusion imaging was performed at rest 45 minutes following the intravenous administration of Technetium 73m Sestamibi. Rest ECG: Sinus bradycardia, LBBB  Stress Procedure:  The patient received IV  adenosine at 140 mcg/kg/min for 4 minutes.  Technetium 26m Sestamibi was injected at the 2 minute mark and quantitative spect images were obtained after a 45 minute delay.  During the infusion of Adenosine the patient complained of flushing and chest tightness.  These symptoms completely resolved in recovery.  Stress ECG: No significant change from baseline ECG  QPS Raw Data Images:  There is significant extracardiac uptake and extracardiac attenuation not impairing ability to read. Stress Images:  Normal homogeneous uptake in all areas of the myocardium. Rest Images:  Normal homogeneous uptake in all areas of the myocardium. Subtraction (SDS):  No evidence of ischemia. Transient Ischemic Dilatation (Normal <1.22):  1.08 Lung/Heart Ratio (Normal <0.45):  0.40  Quantitative Gated Spect Images QGS EDV:  131 ml QGS ESV:  62 ml  Impression Exercise Capacity:  Adenosine study with no exercise. BP Response:  Normal blood pressure response. Clinical Symptoms:  Mild chest pain/dyspnea. ECG Impression:  No significant ST segment change suggestive of ischemia. Comparison with Prior Nuclear Study: No images to compare  Overall Impression:  Low risk stress nuclear study with significant artifacts but no ischemia..  LV Ejection Fraction: 53%.  LV Wall Motion:  Paradoxical septal motion.  Dorothy Spark 11/12/2014

## 2014-11-20 ENCOUNTER — Telehealth: Payer: Self-pay

## 2014-11-20 NOTE — Telephone Encounter (Signed)
called to give pt myoview results.lmtcb

## 2014-11-20 NOTE — Telephone Encounter (Signed)
F/U         Pt returning call about results.     Please call.

## 2014-11-20 NOTE — Telephone Encounter (Signed)
I spoke to the patient 

## 2014-11-20 NOTE — Telephone Encounter (Signed)
Returned pt call. Pt is doing ok he is currently, he does have some chest tightness with exertion.Pt aware of myoview results.abnormal but low risk. Pt has several questions and concerns, rqst to speak with Dr.Smith directly

## 2014-11-20 NOTE — Telephone Encounter (Signed)
-----   Message from Belva Crome, MD sent at 11/15/2014  5:48 PM EST ----- Abnormal but low risk study. Inferior ischemia has improved. How is he doing?

## 2014-11-21 NOTE — Telephone Encounter (Signed)
I personally spoke to the patient

## 2014-11-26 ENCOUNTER — Telehealth: Payer: Self-pay | Admitting: Interventional Cardiology

## 2014-11-26 DIAGNOSIS — Z01812 Encounter for preprocedural laboratory examination: Secondary | ICD-10-CM

## 2014-11-26 NOTE — Telephone Encounter (Signed)
New message      Calling to schedule a cardiac cath

## 2014-11-26 NOTE — Telephone Encounter (Signed)
Pt has spoken with Dr.Smith directly and has decided to proceed with a cardiac cath. Pt will come to the office on 3/24 for preprocedure labs. Pt will pick up preprocedure instructions left at the front desk Pt aware cardiac cath scheduled on 3/25 @ 7:30am with Dr.Smith

## 2014-11-28 ENCOUNTER — Other Ambulatory Visit: Payer: Self-pay | Admitting: Interventional Cardiology

## 2014-11-28 ENCOUNTER — Other Ambulatory Visit (INDEPENDENT_AMBULATORY_CARE_PROVIDER_SITE_OTHER): Payer: 59 | Admitting: *Deleted

## 2014-11-28 DIAGNOSIS — Z01812 Encounter for preprocedural laboratory examination: Secondary | ICD-10-CM | POA: Diagnosis not present

## 2014-11-28 DIAGNOSIS — I209 Angina pectoris, unspecified: Secondary | ICD-10-CM

## 2014-11-28 LAB — CBC WITH DIFFERENTIAL/PLATELET
BASOS ABS: 0 10*3/uL (ref 0.0–0.1)
Basophils Relative: 0.6 % (ref 0.0–3.0)
Eosinophils Absolute: 0.1 10*3/uL (ref 0.0–0.7)
Eosinophils Relative: 1.6 % (ref 0.0–5.0)
HCT: 47.3 % (ref 39.0–52.0)
Hemoglobin: 16.6 g/dL (ref 13.0–17.0)
LYMPHS PCT: 23 % (ref 12.0–46.0)
Lymphs Abs: 1.8 10*3/uL (ref 0.7–4.0)
MCHC: 35 g/dL (ref 30.0–36.0)
MCV: 93 fl (ref 78.0–100.0)
Monocytes Absolute: 1 10*3/uL (ref 0.1–1.0)
Monocytes Relative: 12.5 % — ABNORMAL HIGH (ref 3.0–12.0)
Neutro Abs: 4.9 10*3/uL (ref 1.4–7.7)
Neutrophils Relative %: 62.3 % (ref 43.0–77.0)
PLATELETS: 186 10*3/uL (ref 150.0–400.0)
RBC: 5.09 Mil/uL (ref 4.22–5.81)
RDW: 13.4 % (ref 11.5–15.5)
WBC: 7.9 10*3/uL (ref 4.0–10.5)

## 2014-11-28 LAB — BASIC METABOLIC PANEL
BUN: 13 mg/dL (ref 6–23)
CO2: 30 meq/L (ref 19–32)
CREATININE: 1.44 mg/dL (ref 0.40–1.50)
Calcium: 9.5 mg/dL (ref 8.4–10.5)
Chloride: 105 mEq/L (ref 96–112)
GFR: 53.6 mL/min — AB (ref >60.00)
Glucose, Bld: 86 mg/dL (ref 70–99)
Potassium: 4.2 mEq/L (ref 3.5–5.1)
Sodium: 137 mEq/L (ref 135–145)

## 2014-11-28 LAB — PROTIME-INR
INR: 1.3 ratio — ABNORMAL HIGH (ref 0.8–1.0)
Prothrombin Time: 14.1 s — ABNORMAL HIGH (ref 9.6–13.1)

## 2014-11-29 ENCOUNTER — Encounter (HOSPITAL_COMMUNITY): Payer: Self-pay | Admitting: Pharmacy Technician

## 2014-11-29 ENCOUNTER — Other Ambulatory Visit: Payer: 59

## 2014-11-29 MED ORDER — ASPIRIN 81 MG PO CHEW
81.0000 mg | CHEWABLE_TABLET | ORAL | Status: AC
  Administered 2014-11-30: 81 mg via ORAL

## 2014-11-29 MED ORDER — SODIUM CHLORIDE 0.9 % IV SOLN: 250.0000 mL | INTRAVENOUS | Status: DC | PRN

## 2014-11-29 MED ORDER — SODIUM CHLORIDE 0.9 % IJ SOLN: 3.0000 mL | Freq: Two times a day (BID) | INTRAMUSCULAR | Status: DC

## 2014-11-29 MED ORDER — SODIUM CHLORIDE 0.9 % IV SOLN
INTRAVENOUS | Status: DC
  Administered 2014-11-30: 07:00:00 via INTRAVENOUS

## 2014-11-29 MED ORDER — SODIUM CHLORIDE 0.9 % IJ SOLN: 3.0000 mL | INTRAMUSCULAR | Status: DC | PRN

## 2014-11-30 ENCOUNTER — Encounter (HOSPITAL_COMMUNITY): Payer: Self-pay | Admitting: Interventional Cardiology

## 2014-11-30 ENCOUNTER — Ambulatory Visit (HOSPITAL_COMMUNITY)
Admission: RE | Admit: 2014-11-30 | Discharge: 2014-11-30 | Disposition: A | Discharge status: Home / Self Care | Payer: 59 | Source: Ambulatory Visit | Attending: Interventional Cardiology | Admitting: Interventional Cardiology

## 2014-11-30 ENCOUNTER — Encounter (HOSPITAL_COMMUNITY)
Admission: RE | Disposition: A | Discharge status: Home / Self Care | Payer: Self-pay | Source: Ambulatory Visit | Attending: Interventional Cardiology

## 2014-11-30 DIAGNOSIS — I10 Essential (primary) hypertension: Secondary | ICD-10-CM | POA: Insufficient documentation

## 2014-11-30 DIAGNOSIS — Z87891 Personal history of nicotine dependence: Secondary | ICD-10-CM | POA: Insufficient documentation

## 2014-11-30 DIAGNOSIS — Z951 Presence of aortocoronary bypass graft: Secondary | ICD-10-CM | POA: Insufficient documentation

## 2014-11-30 DIAGNOSIS — I208 Other forms of angina pectoris: Secondary | ICD-10-CM | POA: Diagnosis present

## 2014-11-30 DIAGNOSIS — E785 Hyperlipidemia, unspecified: Secondary | ICD-10-CM | POA: Diagnosis not present

## 2014-11-30 DIAGNOSIS — I447 Left bundle-branch block, unspecified: Secondary | ICD-10-CM | POA: Diagnosis not present

## 2014-11-30 DIAGNOSIS — R079 Chest pain, unspecified: Secondary | ICD-10-CM | POA: Diagnosis present

## 2014-11-30 DIAGNOSIS — I2510 Atherosclerotic heart disease of native coronary artery without angina pectoris: Secondary | ICD-10-CM | POA: Insufficient documentation

## 2014-11-30 DIAGNOSIS — I209 Angina pectoris, unspecified: Secondary | ICD-10-CM

## 2014-11-30 DIAGNOSIS — I25119 Atherosclerotic heart disease of native coronary artery with unspecified angina pectoris: Secondary | ICD-10-CM | POA: Diagnosis present

## 2014-11-30 HISTORY — PX: LEFT HEART CATHETERIZATION WITH CORONARY/GRAFT ANGIOGRAM: SHX5450

## 2014-11-30 LAB — CK TOTAL AND CKMB (NOT AT ARMC)
CK, MB: 1.5 ng/mL (ref 0.3–4.0)
Relative Index: 1.5 (ref 0.0–2.5)
Total CK: 103 U/L (ref 7–232)

## 2014-11-30 SURGERY — LEFT HEART CATHETERIZATION WITH CORONARY/GRAFT ANGIOGRAM

## 2014-11-30 MED ORDER — MIDAZOLAM HCL 2 MG/2ML IJ SOLN
INTRAMUSCULAR | Status: AC
  Filled 2014-11-30: qty 2

## 2014-11-30 MED ORDER — AMLODIPINE BESYLATE 5 MG PO TABS: 5.0000 mg | ORAL_TABLET | Freq: Every day | ORAL | Status: DC

## 2014-11-30 MED ORDER — SODIUM CHLORIDE 0.9 % IV SOLN: INTRAVENOUS | Status: AC

## 2014-11-30 MED ORDER — OXYCODONE-ACETAMINOPHEN 5-325 MG PO TABS: 1.0000 | ORAL_TABLET | ORAL | Status: DC | PRN

## 2014-11-30 MED ORDER — HEPARIN (PORCINE) IN NACL 2-0.9 UNIT/ML-% IJ SOLN
INTRAMUSCULAR | Status: AC
  Filled 2014-11-30: qty 1000

## 2014-11-30 MED ORDER — LIDOCAINE HCL (PF) 1 % IJ SOLN
INTRAMUSCULAR | Status: AC
  Filled 2014-11-30: qty 30

## 2014-11-30 MED ORDER — FENTANYL CITRATE 0.05 MG/ML IJ SOLN
INTRAMUSCULAR | Status: AC
  Filled 2014-11-30: qty 2

## 2014-11-30 MED ORDER — ASPIRIN 81 MG PO CHEW
CHEWABLE_TABLET | ORAL | Status: AC
  Filled 2014-11-30: qty 1

## 2014-11-30 MED ORDER — ONDANSETRON HCL 4 MG/2ML IJ SOLN: 4.0000 mg | Freq: Four times a day (QID) | INTRAMUSCULAR | Status: DC | PRN

## 2014-11-30 MED ORDER — DIPHENHYDRAMINE HCL 50 MG/ML IJ SOLN
INTRAMUSCULAR | Status: AC
  Filled 2014-11-30: qty 1

## 2014-11-30 NOTE — Discharge Instructions (Signed)

## 2014-11-30 NOTE — CV Procedure (Signed)
     Left Heart Catheterization with Coronary and Bypass Angiography  Report  Mark Payne  59 y.o.  male 04/17/1956  Procedure Date: 11/30/2014 Referring Physician: R. Dagmar Hait, MD Primary Cardiologist:: H WB Blenda Bridegroom, [name]  INDICATIONS: Chest pressure at rest. Dyspnea on exertion.  PROCEDURE: 1. Left heart catheterization; 2. Coronary angiography; 3. Left ventriculography; 4. Bypass graft angiography  CONSENT:  The risks, benefits, and details of the procedure were explained in detail to the patient. Risks including death, stroke, heart attack, kidney injury, allergy, limb ischemia, bleeding and radiation injury were discussed.  The patient verbalized understanding and wanted to proceed.  Informed written consent was obtained.  PROCEDURE TECHNIQUE:  After Xylocaine anesthesia a 5 French sheath was placed in the right femoral artery using the modified Seldinger technique.  Coronary angiography was done using a 5 F A2 MP and IMA diagnostic catheters.  Left ventriculography was done using the 5 French A2 MP catheter and hand injection.   Digital images were reviewed. The digital images were compared with pre-PCI images from September 2015. The case was terminated. Hemostasis was achieved with manual compression   CONTRAST:  Total of 100 cc.  COMPLICATIONS:  None   HEMODYNAMICS:  Aortic pressure 117/64 mmHg; LV pressure 117/0 mmHg; LVEDP 10 mmHg;  ANGIOGRAPHIC DATA:   The left main coronary artery is patent.  The left anterior descending artery is 95% obstructed proximal to a previously placed stent in the proximal vessel. Post stent there is an 80% obstruction and then a moderate size septal perforator is supplied. Faint competitive flow with the mid and distal LAD is noted..  The left circumflex artery is patent. There is proximal irregularity in the ostial to proximal one third. No obstruction greater than 50% is noted..  The right coronary artery is dominant. There is stent from  the proximal to the mid vessel. Angiography reveals luminal irregularities with up to 40-50% narrowing in the midsegment and distal to the previously stented region.Marland Kitchen  BYPASS GRAFT ANGIOGRAPHY:  1. Left Internal mammary artery graft to the LAD: This graft has 2 distal anastomoses with a side to side anastomosis in the mid LAD and an end to side anastomosis in the distal LAD. The graft is widely patent. The native LAD is diffusely diseased in the apical mid and proximal segment. The septal perforator in the proximal distribution is potentially threaten.  2. Free radial to diagonal: Widely patent. There is some tortuosity in the graft with kinking but no significant obstruction is seen.    LEFT VENTRICULOGRAM:  Left ventricular angiogram was done in the 30 RAO projection and revealed 45-50% with a clear mid anterior wall region of severe hypokinesis. This is similar to the prior angiogram.   IMPRESSIONS:  1. Widely patent right coronary stents 2. Widely patent LIMA and free radial graft as outlined above 3. Severely and diffusely diseased native LAD 4. Abnormal left ventricular function with mid anterior wall severe hypokinesis and low normal ejection fraction. The wall motion abnormality is likely due to severe diffuse disease in the mid LAD.   RECOMMENDATION:  Medical therapy. Discontinue Bystolic. Start amlodipine. Consider Ranexa. Clinical follow-up in 6 weeks.

## 2014-11-30 NOTE — H&P (Signed)
HISTORY and PHYSICAL   Date:  11/30/2014   ID:  Mark Payne, Mark Payne 1955-11-01, MRN [id number]  PCP:  Tivis Ringer, MD  Cardiologist:   Sinclair Grooms, MD   No chief complaint on file.     History of Present Illness: Mark Payne is a 59 y.o. male who presents for coronary angiography. The patient has a history of coronary artery disease dating back to 2003. He is undergone coronary bypass grafting. He had patency of the LIMA and free radial graft to the LAD and diagonal respectively in 2015. At that time he was found to have severe progression of right coronary disease and multiple stents were placed in the right coronary. Over the past 2 months he has noted exertional fatigue, recurring tightness in the chest at rest, and has an abnormal myocardial perfusion study that was described as low risk but with clear evidence of inferior perfusion and anterior wall perfusion abnormalities. No significant associated ischemia was noted. Given ongoing symptoms, angiography is being performed to investigate anatomy.   Past Medical History  Diagnosis Date  . LBBB (left bundle branch block) 04/02/2014  . Hyperlipidemia 04/02/2014  . Exertional angina 04/02/2014  . Essential hypertension 04/02/2014  . CAD (coronary artery disease) of artery bypass graft 04/02/2014    LIMA to LAD and free radial to diagonal #1 [id number]. PCI of RCA with DES May 2011  The most recent coronary angiogram in 2013 demonstrated a patent LIMA to LAD, patent free radial to diagonal, patent right coronary artery with a patent mid and distal stent. The first of perforator was threatened. The native LAD was subtotally occluded . It was felt that the anatomy was not significantly different than the prior cath in 2011.     Past Surgical History  Procedure Laterality Date  . Left heart catheterization with coronary/graft angiogram N/A 11/13/2011    Procedure: LEFT HEART CATHETERIZATION WITH Beatrix Fetters;  Surgeon:  Sinclair Grooms, MD;  Location: [facility] CATH LAB;  Service: Cardiovascular;  Laterality: N/A;  . Left heart catheterization with coronary/graft angiogram N/A 05/18/2014    Procedure: LEFT HEART CATHETERIZATION WITH Beatrix Fetters;  Surgeon: Sinclair Grooms, MD;  Location: [facility] CATH LAB;  Service: Cardiovascular;  Laterality: N/A;     Current Facility-Administered Medications  Medication Dose Route Frequency Provider Last Rate Last Dose  . 0.9 %  sodium chloride infusion  250 mL Intravenous PRN Belva Crome, MD      . 0.9 %  sodium chloride infusion   Intravenous Continuous Belva Crome, MD 150 mL/hr at 11/30/14 [phone]    . aspirin 81 MG chewable tablet           . sodium chloride 0.9 % injection 3 mL  3 mL Intravenous Q12H Belva Crome, MD      . sodium chloride 0.9 % injection 3 mL  3 mL Intravenous PRN Belva Crome, MD        Allergies:   Review of patient's allergies indicates no known allergies.    Social History:  The patient  reports that he has quit smoking. He does not have any smokeless tobacco history on file. He reports that he drinks alcohol. He reports that he does not use illicit drugs.   Family History:  The patient's positive for coronary artery disease, father. family history is not on file.    ROS:  Please see the history of present illness.   Otherwise, review of  systems are positive for none.   All other systems are reviewed and negative.    PHYSICAL EXAM: VS:  BP 127/84 mmHg  Pulse 51  Temp(Src) 98 F (36.7 C) (Oral)  Resp 18  Ht 5\' 9"  (1.753 m)  Wt 180 lb (81.647 kg)  BMI 26.57 kg/m2  SpO2 96% , BMI Body mass index is 26.57 kg/(m^2). GEN: Well nourished, well developed, in no acute distress HEENT: normal Neck: no JVD, carotid bruits, or masses Cardiac: RRR; no murmurs, rubs, or gallops,no edema  Respiratory:  clear to auscultation bilaterally, normal work of breathing GI: soft, nontender, nondistended, + BS MS: no deformity or atrophy Skin:  warm and dry, no rash Neuro:  Strength and sensation are intact Psych: euthymic mood, full affect   EKG:  EKG is ordered today. The ekg performed on 11/30/2014 demonstrates sinus bradycardia with left bundle branch block. This is unchanged from prior studies.    Recent Labs: 11/28/2014: BUN 13; Creatinine 1.44; Hemoglobin 16.6; Platelets 186.0; Potassium 4.2; Sodium 137    Lipid Panel No results found for: CHOL, TRIG, HDL, CHOLHDL, VLDL, LDLCALC, LDLDIRECT    Wt Readings from Last 3 Encounters:  11/30/14 180 lb (81.647 kg)  11/12/14 189 lb (85.73 kg)  05/25/14 183 lb (83.008 kg)      Other studies Reviewed: Additional studies/ records that were reviewed today include: all prior coronary angiogram images. Review of the above records demonstrates:  Patent LIMA to LAD and free radial to diagonal. There is diffuse proximal LAD disease with threatened septal perforator. There is diffuse disease in the LAD proximal segment. The right coronary has diffuse disease before following angioplasty and stenting was wide patency and to moderate distal distribution.   ASSESSMENT AND PLAN:  28. 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 The LAD has had a patent LIMA documented on several occasions. The free radial to the diagonal has been widely patent as recently as a year ago. The right coronary was stented a year ago. The threatened territory is a large septal perforator with 99% proximal LAD stenosis.  2. Hyperlipidemia    Current medicines are reviewed at length with the patient today.       The patient does not have concerns regarding medicines.  The following changes have been made:  Coronary angiography with bypass graft angiography and possible stenting will be performed today if indicated. The risk of stroke, death, myocardial infarction, emergency surgery, bleeding, among others were discussed in detail and  except above the patient.  Labs/ tests ordered today include:  Orders Placed This Encounter  Procedures  . Diet NPO time specified Except for: Sips with Meds  . Obtain Consent  . Clip L radial (no IV/bracelet L wrist)  . Clip R groin  . AM of procedure hold 70/30 insulin dose (if ordered)  . AM of procedure hold oral hypoglycemic agents (if ordered)  . Confirm ECG done within 30 days prior to procedure ( If PV procedure within 12 months)  If no results please order  . Confirm the following lab results are available within 7 days prior to procedure for inpatients:  14 days prior to procedure for outpatients   BMP(CMP acceptable), CBC, PT/INR. Order if no results within 7-day range. Lab work from other hospitals is acceptable.   . Discontinue Heparin infusion on call to Cath Lab  . Initiate Cath/PCI clinical path; encourage patient to watch CCTV video  . Verify aspirin and /  or anti-platelet medication (Plavix, Effient, Brilinta) dose available for cardiac / peripheral vascular procedure day. IF ordered daily / once, adjust schedule to administer before procedure.  . Verify informed consent  . Weigh patient  . EKG 12-Lead  . Insert peripheral IV X 2, second IV site NSL     Disposition:  Will depend upon findings of coronary angiography    Signed, Sinclair Grooms, MD  11/30/2014 7:17 AM    [address], [place], [place]  [address] Phone: [phone]; Fax: [phone]

## 2014-11-30 NOTE — Research (Signed)
Bioflow Informed Consent   Subject Name: Mark Payne  Subject met inclusion and exclusion criteria.  The informed consent form, study requirements and expectations were reviewed with the subject and questions and concerns were addressed prior to the signing of the consent form.  The subject verbalized understanding of the trail requirements.  The subject agreed to participate in the Bioflow trial and signed the informed consent.  The informed consent was obtained prior to performance of any protocol-specific procedures for the subject.  A copy of the signed informed consent was given to the subject and a copy was placed in the subject's medical record.  Vivian Garman 11/30/2014, 07:10  

## 2014-11-30 NOTE — Progress Notes (Addendum)
Site area: right groin a 5 french arterial sheath was removed  Site Prior to Removal:  Level 0  Pressure Applied For 20 MINUTES    Minutes Beginning at 0910a  Manual:   Yes.    Patient Status During Pull:  stable  Post Pull Groin Site:  Level 0  Post Pull Instructions Given:  Yes.    Post Pull Pulses Present:  Yes.    Dressing Applied:  Yes.    Comments:  VS remain stable during sheath pull.  Pt denies any discomfort at the site.

## 2014-11-30 NOTE — Progress Notes (Signed)
Generalized skin erythema. No itching. 25 mg of IV Benadryl administered.

## 2014-12-13 ENCOUNTER — Telehealth: Payer: Self-pay | Admitting: Interventional Cardiology

## 2014-12-13 MED ORDER — AMLODIPINE BESYLATE 10 MG PO TABS: 10.0000 mg | ORAL_TABLET | Freq: Every day | ORAL | Status: DC

## 2014-12-13 NOTE — Telephone Encounter (Signed)
New message      Pt want a refill on his amlodipine.  He currently takes 5mg  but want to "bump" it up to 10mg  with a 90 day supply.  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  Please call it in to [facility] pharmacy

## 2014-12-13 NOTE — Telephone Encounter (Signed)
Spoke with patient. Amlodipine is working well.  No side effects.  No chest pain. BP running 130s-140s / 85-95 consistently He would like to know if Dr. Tamala Julian will increase amlodipine to 10 mg daily.  Also discussed that pt needs f/u appointment for 6 wks after cath per Dr. Thompson Caul note. Adv I will send message to Dr. Thompson Caul CMA to try to schedule this.  (Dr. Thompson Caul first available is June 13.)  Pt is appreciative for the call.  He is aware I am sending to Dr. Tamala Julian re: amlodipine dose.

## 2014-12-13 NOTE — Telephone Encounter (Signed)
Increase amlodipine to 10 mg daily. Causes some to have mild ankle edema.

## 2014-12-13 NOTE — Telephone Encounter (Signed)
Forwarding to D

## 2014-12-14 NOTE — Telephone Encounter (Signed)
Pt sts that he has been doing better since switching from a beta blocker to Amlodipine, the fatigue and sob has resolved  Pt aware of Dr.Smith's recommendation. Increase amlodipine to 10 mg daily. Rx sent to pt pharmacy [facility]. 6 wk post cath f/u with Dr.Smith scheduled on 5/2 @ 12pm. Pt verbalized understanding.

## 2015-01-07 ENCOUNTER — Ambulatory Visit: Payer: 59 | Admitting: Interventional Cardiology

## 2015-01-07 ENCOUNTER — Encounter: Payer: Self-pay | Admitting: Interventional Cardiology

## 2015-01-07 ENCOUNTER — Ambulatory Visit (INDEPENDENT_AMBULATORY_CARE_PROVIDER_SITE_OTHER): Payer: 59 | Admitting: Interventional Cardiology

## 2015-01-07 VITALS — BP 118/82 | HR 74 | Ht 68.0 in | Wt 192.8 lb

## 2015-01-07 DIAGNOSIS — I10 Essential (primary) hypertension: Secondary | ICD-10-CM

## 2015-01-07 DIAGNOSIS — I447 Left bundle-branch block, unspecified: Secondary | ICD-10-CM

## 2015-01-07 DIAGNOSIS — E785 Hyperlipidemia, unspecified: Secondary | ICD-10-CM

## 2015-01-07 DIAGNOSIS — I25708 Atherosclerosis of coronary artery bypass graft(s), unspecified, with other forms of angina pectoris: Secondary | ICD-10-CM | POA: Diagnosis not present

## 2015-01-07 NOTE — Progress Notes (Signed)
Cardiology Office Note   Date:  01/07/2015   ID:  Mark, Payne 07-06-1956, MRN [id number]  PCP:  Tivis Ringer, MD  Cardiologist:   Sinclair Grooms, MD   Chief Complaint  Patient presents with  . Coronary Artery Disease      History of Present Illness: Mark Payne is a 59 y.o. male who presents for CAD, hyperlipidemia, hypertension, and history of prior coronary artery bypass grafting with LIMA to LAD, free radial to ramus intermedius, and multiple stents to the native right coronary.  His exertional tolerance is improved since angioplasty and stenting of the right coronary. Exertional fatigue also improved further after discontinuation of beta blocker therapy,Bystolic, and initiation of amlodipine. He has not needed sublingual nitroglycerin. His had no discomfort at rest. He denies syncope. Overall he feels improved.  We thank considerable time reviewing his last 2 coronary angiogram images.    Past Medical History  Diagnosis Date  . LBBB (left bundle branch block) 04/02/2014  . Hyperlipidemia 04/02/2014  . Exertional angina 04/02/2014  . Essential hypertension 04/02/2014  . CAD (coronary artery disease) of artery bypass graft 04/02/2014    LIMA to LAD and free radial to diagonal #1 20075. PCI of RCA with DES May 2011  The most recent coronary angiogram in 2013 demonstrated a patent LIMA to LAD, patent free radial to diagonal, patent right coronary artery with a patent mid and distal stent. The first of perforator was threatened. The native LAD was subtotally occluded . It was felt that the anatomy was not significantly different than the prior cath in 2011.     Past Surgical History  Procedure Laterality Date  . Left heart catheterization with coronary/graft angiogram N/A 11/13/2011    Procedure: LEFT HEART CATHETERIZATION WITH Beatrix Fetters;  Surgeon: Sinclair Grooms, MD;  Location: [facility] CATH LAB;  Service: Cardiovascular;  Laterality: N/A;  . Left  heart catheterization with coronary/graft angiogram N/A 05/18/2014    Procedure: LEFT HEART CATHETERIZATION WITH Beatrix Fetters;  Surgeon: Sinclair Grooms, MD;  Location: [facility] CATH LAB;  Service: Cardiovascular;  Laterality: N/A;  . Left heart catheterization with coronary/graft angiogram N/A 11/30/2014    Procedure: LEFT HEART CATHETERIZATION WITH Beatrix Fetters;  Surgeon: Belva Crome, MD;  Location: [facility] CATH LAB;  Service: Cardiovascular;  Laterality: N/A;     Current Outpatient Prescriptions  Medication Sig Dispense Refill  . amLODipine (NORVASC) 10 MG tablet Take 1 tablet (10 mg total) by mouth daily. 90 tablet 3  . aspirin 81 MG tablet Take 81 mg by mouth daily.    . clopidogrel (PLAVIX) 75 MG tablet Take 1 tablet (75 mg total) by mouth daily. 90 tablet 3  . famotidine (PEPCID) 10 MG tablet Take 10 mg by mouth daily as needed for heartburn.     . levothyroxine (SYNTHROID, LEVOTHROID) 200 MCG tablet Take 200 mcg by mouth daily before breakfast.    . Melatonin 3 MG CAPS Take 3 mg by mouth at bedtime as needed (for sleep).    . nitroGLYCERIN (NITROSTAT) 0.4 MG SL tablet Place 1 tablet (0.4 mg total) under the tongue every 5 (five) minutes as needed for chest pain. 25 tablet 3  . rosuvastatin (CRESTOR) 20 MG tablet Take 1 tablet (20 mg total) by mouth daily. 90 tablet 3  . tamsulosin (FLOMAX) 0.4 MG CAPS capsule Take 0.4 mg by mouth daily as needed (for prostate).      No current facility-administered medications for this  visit.    Allergies:   Review of patient's allergies indicates no known allergies.    Social History:  The patient  reports that he has quit smoking. He does not have any smokeless tobacco history on file. He reports that he drinks alcohol. He reports that he does not use illicit drugs.   Family History:  The patient's family history includes Breast cancer in his mother; CAD in his father; Healthy in his brother, brother, and brother; Heart disease in  his mother.    ROS:  Please see the history of present illness.   Otherwise, review of systems are positive for difficulty with exertion due to dyspnea but it improves after a period of warmup no chest discomfort.   All other systems are reviewed and negative.    PHYSICAL EXAM: VS:  BP 118/82 mmHg  Pulse 74  Ht 5\' 8"  (1.727 m)  Wt 192 lb 12.8 oz (87.454 kg)  BMI 29.32 kg/m2  SpO2 94% , BMI Body mass index is 29.32 kg/(m^2). GEN: Well nourished, well developed, in no acute distress HEENT: normal Neck: no JVD, carotid bruits, or masses Cardiac: RRR; no murmurs, rubs, or gallops,no edema  Respiratory:  clear to auscultation bilaterally, normal work of breathing GI: soft, nontender, nondistended, + BS MS: no deformity or atrophy Skin: warm and dry, no rash Neuro:  Strength and sensation are intact Psych: euthymic mood, full affect   EKG:  EKG is not ordered today.    Recent Labs: 11/28/2014: BUN 13; Creatinine 1.44; Hemoglobin 16.6; Platelets 186.0; Potassium 4.2; Sodium 137    Lipid Panel No results found for: CHOL, TRIG, HDL, CHOLHDL, VLDL, LDLCALC, LDLDIRECT    Wt Readings from Last 3 Encounters:  01/07/15 192 lb 12.8 oz (87.454 kg)  11/12/14 189 lb (85.73 kg)  05/25/14 183 lb (83.008 kg)      Other studies Reviewed: Additional studies/ records that were reviewed today include: .   ASSESSMENT AND PLAN:  Coronary artery disease involving coronary bypass graft with other forms of angina pectoris:  Denies angina. Recently had repeat coronary angiography in the region of RCA stent is widely patent. His recent symptoms seem to have been related to beta blocker therapy. After weaning and discontinuing Bystolic, his exertional tolerance has significantly improved.  Essential hypertension: : Controlled  Hyperlipidemia:  LBBB- new  Essential Hypertension: Controlled     Current medicines are reviewed at length with the patient today.  The patient has concerns  regarding medicines.  The following changes have been made:  Has not been able to tolerate beta blocker therapy because of fatigue and dyspnea with physical activity.  Labs/ tests ordered today include:  No orders of the defined types were placed in this encounter.     Disposition:   FU with HS in 9 months  Signed, Sinclair Grooms, MD  01/07/2015 1:30 PM    [address], [place], [place]  [address] Phone: [phone]; Fax: [phone]

## 2015-01-07 NOTE — Patient Instructions (Signed)
Medication Instructions:  Your physician recommends that you continue on your current medications as directed. Please refer to the Current Medication list given to you today.    Labwork: None   Testing/Procedures: None   Follow-Up: Your physician wants you to follow-up in: 6 months with Dr.Smith You will receive a reminder letter in the mail two months in advance. If you don't receive a letter, please call our office to schedule the follow-up appointment.   Any Other Special Instructions Will Be Listed Below (If Applicable).   

## 2015-01-30 ENCOUNTER — Ambulatory Visit: Payer: 59 | Admitting: Nurse Practitioner

## 2015-06-10 ENCOUNTER — Other Ambulatory Visit: Payer: Self-pay | Admitting: Interventional Cardiology

## 2015-08-30 ENCOUNTER — Other Ambulatory Visit: Payer: Self-pay | Admitting: Gastroenterology

## 2015-09-30 NOTE — Progress Notes (Signed)
Cardiology Office Note:    Date:  10/01/2015   ID:  Mark Payne, Mark Payne 06-29-1956, MRN [id number]  PCP:  Tivis Ringer, MD  Cardiologist:  Dr. Daneen Schick   Electrophysiologist:  n/a  Chief Complaint  Patient presents with  . 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 He underwent CABG in 2005 and subsequent PCI to the RCA x 2 with a DES (2011 and 2015).  LHC in 3/16 demonstrated patent bypass grafts and patent RCA stents. Last seen by Dr. Tamala Julian 5/16. Patient noted improved exercise tolerance after discontinuation of beta blocker.   He comes in today with complaints of chest discomfort over the past 2 weeks. It's a left-sided pressure. He feels that it's more related to stress than anything. He can exert himself with resolution of symptoms. He has had some improvement with antacids. He's not noticed much change with nitroglycerin. He denies associated symptoms. However, he does note that his chest symptoms are similar to what he has had in the past with his anginal equivalent. He denies orthopnea, PND. He had some mild pedal edema with Norvasc cut back on the dose. The edema resolved. He denies pleuritic or supine chest pain.   Past Medical History  Diagnosis Date  . LBBB (left bundle branch block) 04/02/2014  . Hyperlipidemia 04/02/2014  . Exertional angina ([place]) 04/02/2014  . Essential hypertension 04/02/2014  . CAD (coronary artery disease) of artery bypass graft 04/02/2014    LIMA to LAD and free radial to diagonal #[id number]. PCI of RCA with DES May 2011  The most recent coronary angiogram in 2013 demonstrated a patent LIMA to LAD, patent free radial to diagonal, patent right coronary artery with a patent mid and distal stent. The first of perforator was threatened. The native LAD was subtotally occluded . It was felt that the anatomy was not  significantly different than the prior cath in 2011.     Past Surgical History  Procedure Laterality Date  . Left heart catheterization with coronary/graft angiogram N/A 11/13/2011    Procedure: LEFT HEART CATHETERIZATION WITH Beatrix Fetters;  Surgeon: Sinclair Grooms, MD;  Location: [facility] CATH LAB;  Service: Cardiovascular;  Laterality: N/A;  . Left heart catheterization with coronary/graft angiogram N/A 05/18/2014    Procedure: LEFT HEART CATHETERIZATION WITH Beatrix Fetters;  Surgeon: Sinclair Grooms, MD;  Location: [facility] CATH LAB;  Service: Cardiovascular;  Laterality: N/A;  . Left heart catheterization with coronary/graft angiogram N/A 11/30/2014    Procedure: LEFT HEART CATHETERIZATION WITH Beatrix Fetters;  Surgeon: Belva Crome, MD;  Location: [facility] CATH LAB;  Service: Cardiovascular;  Laterality: N/A;    Current Medications: Outpatient Prescriptions Prior to Visit  Medication Sig Dispense Refill  . amLODipine (NORVASC) 10 MG tablet Take 1 tablet (10 mg total) by mouth daily. 90 tablet 3  . aspirin 81 MG tablet Take 81 mg by mouth daily.    . clopidogrel (PLAVIX) 75 MG tablet TAKE ONE TABLET BY MOUTH ONCE DAILY 30 tablet 6  . famotidine (PEPCID) 10 MG tablet Take 10 mg by mouth daily as needed for heartburn.     . levothyroxine (SYNTHROID, LEVOTHROID) 200 MCG tablet Take 200 mcg by mouth daily before breakfast.    . Melatonin 3 MG CAPS Take 3 mg by mouth at bedtime  as needed (for sleep).    . nitroGLYCERIN (NITROSTAT) 0.4 MG SL tablet Place 1 tablet (0.4 mg total) under the tongue every 5 (five) minutes as needed for chest pain. 25 tablet 3  . rosuvastatin (CRESTOR) 20 MG tablet TAKE ONE TABLET BY MOUTH ONCE DAILY 30 tablet 6  . tamsulosin (FLOMAX) 0.4 MG CAPS capsule Take 0.4 mg by mouth daily as needed (for prostate).      No facility-administered medications prior to visit.     Allergies:   Vistaril and Zocor   Social History   Social History  .  Marital Status: Married    Spouse Name: N/A  . Number of Children: N/A  . Years of Education: N/A   Occupational History  . physician     Anesthesiologist   Social History Main Topics  . Smoking status: Former Research scientist (life sciences)  . Smokeless tobacco: None  . Alcohol Use: Yes  . Drug Use: No  . Sexual Activity: Yes   Other Topics Concern  . None   Social History Narrative     Family History:  The patient's family history includes Breast cancer in his mother; CAD in his father; Healthy in his brother, brother, and brother; Heart disease in his mother.   ROS:   Please see the history of present illness.    Review of Systems  Cardiovascular: Positive for chest pain and leg swelling.  All other systems reviewed and are negative.   Physical Exam:    VS:  BP 118/70 mmHg  Pulse 52  Ht 5\' 8"  (624THL m)  Wt 191 lb 6.4 oz (86.818 kg)  BMI 29.11 kg/m2   GEN: Well nourished, well developed, in no acute distress HEENT: normal Neck: no JVD, no masses Cardiac: Normal S1/S2, RRR; no murmurs, no edema   Respiratory:  clear to auscultation bilaterally; no wheezing, rhonchi or rales GI: soft, nontender MS: no deformity or atrophy Skin: warm and dry, no rash Neuro:  no focal deficits  Psych: Alert and oriented x 3, normal affect  Wt Readings from Last 3 Encounters:  10/01/15 191 lb 6.4 oz (86.818 kg)  01/07/15 192 lb 12.8 oz (87.454 kg)  11/30/14 180 lb (81.647 kg)      Studies/Labs Reviewed:    EKG:  EKG is  ordered today.  The ekg ordered today demonstrates sinus brady, HR 45, LBBB  Recent Labs: 11/28/2014: BUN 13; Creatinine, Ser 1.44; Hemoglobin 16.6; Platelets 186.0; Potassium 4.2; Sodium 137   Recent Lipid Panel No results found for: CHOL, TRIG, HDL, CHOLHDL, VLDL, LDLCALC, LDLDIRECT  Additional studies/ records that were reviewed today include:   LHC 11/30/14 LM patent LAD prox 95% before stent, 80% after stent LCx irregularities (nothing > 50%) RCA prox to mid stent ok,  mid 40-50% L-LAD patent Free Radial-Dx patent EF 45-50%  Myoview 11/13/14 Low risk stress nuclear study with significant artifacts but no ischemia.  LV Ejection Fraction: 53%. LV Wall Motion: Paradoxical septal motion.    ASSESSMENT:    1. Other chest pain   2. Coronary artery disease involving native coronary artery of native heart without angina pectoris   3. Essential hypertension   4. Hyperlipidemia   5. LBBB      PLAN:    In order of problems listed above:  1. Chest pain - He has typical and atypical symptoms. He has chronic LBBB.  Cardiac catheterization last year demonstrated patent RCA stents and patent bypass grafts. Myoview prior to that was low risk and negative for  ischemia. He is uncertain what to make of his symptoms as they do remind him of his previous angina. However, he can exert himself without worsening symptoms and nitroglycerin does not seem to improve the symptoms. He does have some symptoms that sound consistent with a GI etiology.  -  Trial of Prilosec 20 mg daily 2 weeks, then taper off  -  Arrange Lexiscan Myoview. If significant ischemia, he will need repeat cardiac catheterization  CAD - s/p CABG and multiple PCI procedures to native RCA.  Last [facility] 3/16 with patent grafts and patent RCA stents.  As noted, Lexiscan Myoview will be obtained due to recurrent chest discomfort. If he has no significant improvement with PPI and his stress test remains low risk, consider addition of Ranexa. Continue aspirin, Plavix, statin, amlodipine.    2. HTN - Controlled.  3. HL - Continue statin.  4. LBBB - Old    Medication Adjustments/Labs and Tests Ordered: Current medicines are reviewed at length with the patient today.  Concerns regarding medicines are outlined above.  Medication changes, Labs and Tests ordered today are outlined in the Patient Instructions noted below. Patient Instructions  Your physician has recommended you make the following change in your  medication: OTC Prilosec 20 mg by mouth once daily for 2 weeks, then taper off.  Your physician has requested that you have a lexiscan myoview. For further information please visit [email or web address]. Please follow instruction sheet, as given.  Your physician recommends that you schedule a follow-up appointment in: 6 weeks with Dr. Tamala Julian     Signed, Richardson Dopp, PA-C  10/01/2015 4:31 PM    [facility] [address], [place], [place]  [address] Phone: [phone]; Fax: [phone]

## 2015-10-01 ENCOUNTER — Encounter: Payer: Self-pay | Admitting: Physician Assistant

## 2015-10-01 ENCOUNTER — Ambulatory Visit (INDEPENDENT_AMBULATORY_CARE_PROVIDER_SITE_OTHER): Payer: BLUE CROSS/BLUE SHIELD | Admitting: Physician Assistant

## 2015-10-01 VITALS — BP 118/70 | HR 52 | Ht 68.0 in | Wt 191.4 lb

## 2015-10-01 DIAGNOSIS — I2510 Atherosclerotic heart disease of native coronary artery without angina pectoris: Secondary | ICD-10-CM | POA: Diagnosis not present

## 2015-10-01 DIAGNOSIS — I10 Essential (primary) hypertension: Secondary | ICD-10-CM | POA: Diagnosis not present

## 2015-10-01 DIAGNOSIS — E785 Hyperlipidemia, unspecified: Secondary | ICD-10-CM

## 2015-10-01 DIAGNOSIS — R0789 Other chest pain: Secondary | ICD-10-CM | POA: Diagnosis not present

## 2015-10-01 DIAGNOSIS — I447 Left bundle-branch block, unspecified: Secondary | ICD-10-CM | POA: Diagnosis not present

## 2015-10-01 NOTE — Patient Instructions (Signed)
Your physician has recommended you make the following change in your medication: OTC Prilosec 20 mg by mouth once daily for 2 weeks, then taper off.  Your physician has requested that you have a lexiscan myoview. For further information please visit [email or web address]. Please follow instruction sheet, as given.  Your physician recommends that you schedule a follow-up appointment in: 6 weeks with Dr. Tamala Julian

## 2015-10-14 ENCOUNTER — Encounter (HOSPITAL_COMMUNITY): Payer: BLUE CROSS/BLUE SHIELD

## 2015-11-19 ENCOUNTER — Ambulatory Visit: Payer: BLUE CROSS/BLUE SHIELD | Admitting: Interventional Cardiology

## 2015-11-24 NOTE — Progress Notes (Signed)
Cardiology Office Note:    Date:  11/25/2015   ID:  Mark, Payne May 12, 1956, MRN [id number]  PCP:  Mark Ringer, MD  Cardiologist:  Dr. Daneen Payne   Electrophysiologist:  n/a  Chief Complaint  Patient presents with  . 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 He underwent CABG in 2005 and subsequent PCI to the RCA x 2 with a DES (2011 and 2015). LHC in 3/16 demonstrated patent bypass grafts and patent RCA stents. Last seen by Dr. Tamala Payne 5/16. Patient noted improved exercise tolerance after discontinuation of beta blocker.   I saw him in 1/17. He was complaining of more chest pain. I suggested that he undergo a pharmacologic nuclear stress test and take a proton pump inhibitor for a couple of weeks. He took a PPI but never got the stress test. He was feeling better for a time. However, over the past 2 weeks he noted increasing amounts of chest pain. His symptoms remain atypical. He can usually exercise to extreme levels without chest discomfort. However, he has sharp chest discomfort at rest. He often notes it with increasing emotional stress. He denies syncope, orthopnea, PND. He is interested in trying Ranexa to see if this will help. Overall, he does not feel that PPI therapy helped him all that much.   Past Medical History  Diagnosis Date  . LBBB (left bundle branch block) 04/02/2014  . Hyperlipidemia 04/02/2014  . Exertional angina ([place]) 04/02/2014  . Essential hypertension 04/02/2014  . CAD (coronary artery disease) of artery bypass graft 04/02/2014    LIMA to LAD and free radial to diagonal #1 [id number]. PCI of RCA with DES May 2011  The most recent coronary angiogram in 2013 demonstrated a patent LIMA to LAD, patent free radial to diagonal, patent right coronary artery with a patent mid and distal stent. The first of perforator was threatened. The native LAD  was subtotally occluded . It was felt that the anatomy was not significantly different than the prior cath in 2011.     Past Surgical History  Procedure Laterality Date  . Left heart catheterization with coronary/graft angiogram N/A 11/13/2011    Procedure: LEFT HEART CATHETERIZATION WITH Beatrix Fetters;  Surgeon: Mark Grooms, MD;  Location: [facility] CATH LAB;  Service: Cardiovascular;  Laterality: N/A;  . Left heart catheterization with coronary/graft angiogram N/A 05/18/2014    Procedure: LEFT HEART CATHETERIZATION WITH Beatrix Fetters;  Surgeon: Mark Grooms, MD;  Location: [facility] CATH LAB;  Service: Cardiovascular;  Laterality: N/A;  . Left heart catheterization with coronary/graft angiogram N/A 11/30/2014    Procedure: LEFT HEART CATHETERIZATION WITH Beatrix Fetters;  Surgeon: Mark Crome, MD;  Location: [facility] CATH LAB;  Service: Cardiovascular;  Laterality: N/A;    Current Medications: Outpatient Prescriptions Prior to Visit  Medication Sig Dispense Refill  . amLODipine (NORVASC) 10 MG tablet Take 1 tablet (10 mg total) by mouth daily. 90 tablet 3  . aspirin 81 MG tablet Take 81 mg by mouth daily.    . clopidogrel (PLAVIX) 75 MG tablet TAKE ONE TABLET BY MOUTH ONCE DAILY 30 tablet 6  . co-enzyme Q-10 50 MG capsule Take 50 mg by mouth daily.    Marland Kitchen levothyroxine (SYNTHROID, LEVOTHROID) 200 MCG tablet Take 200 mcg by mouth daily before breakfast.    .  Melatonin 3 MG CAPS Take 3 mg by mouth at bedtime as needed (for sleep).    . Multiple Vitamin (MULTI VITAMIN DAILY PO) Take 1 tablet by mouth daily. Patient take 1 tablet by mouth once daily. Patient not sure of dosage.    . Omega-3 Fatty Acids (FISH OIL PO) Take 1,000 mg by mouth 3 (three) times daily.    . rosuvastatin (CRESTOR) 20 MG tablet TAKE ONE TABLET BY MOUTH ONCE DAILY 30 tablet 6  . tamsulosin (FLOMAX) 0.4 MG CAPS capsule Take 0.4 mg by mouth daily as needed (for prostate).     . nitroGLYCERIN (NITROSTAT)  0.4 MG SL tablet Place 1 tablet (0.4 mg total) under the tongue every 5 (five) minutes as needed for chest pain. 25 tablet 3  . famotidine (PEPCID) 10 MG tablet Take 10 mg by mouth daily as needed for heartburn.      No facility-administered medications prior to visit.     Allergies:   Vistaril and Zocor   Social History   Social History  . Marital Status: Married    Spouse Name: N/A  . Number of Children: N/A  . Years of Education: N/A   Occupational History  . physician     Anesthesiologist   Social History Main Topics  . Smoking status: Former Research scientist (life sciences)  . Smokeless tobacco: None  . Alcohol Use: Yes  . Drug Use: No  . Sexual Activity: Yes   Other Topics Concern  . None   Social History Narrative     Family History:  The patient's family history includes Breast cancer in his mother; CAD in his father; Healthy in his brother, brother, and brother; Heart disease in his mother.   ROS:   Please see the history of present illness.    ROS All other systems reviewed and are negative.   Physical Exam:    VS:  BP 130/80 mmHg  Pulse 68  Ht 5\' 8"  (1.727 m)  Wt 190 lb 1.9 oz (86.238 kg)  BMI 28.91 kg/m2   GEN: Well nourished, well developed, in no acute distress HEENT: normal Neck: no JVD, no masses Cardiac: Normal S1/S2,  RRR; no murmurs, rubs, or gallops, no edema   Respiratory:  clear to auscultation bilaterally; no wheezing, rhonchi or rales GI: soft, nontender, nondistended MS: no deformity or atrophy Skin: warm and dry Neuro: No focal deficits  Psych: Alert and oriented x 3, normal affect  Wt Readings from Last 3 Encounters:  11/25/15 190 lb 1.9 oz (86.238 kg)  10/01/15 191 lb 6.4 oz (86.818 kg)  01/07/15 192 lb 12.8 oz (87.454 kg)      Studies/Labs Reviewed:     EKG:  EKG is  ordered today.  The ekg ordered today demonstrates Sinus brady, HR 57, LBBB  Recent Labs: 11/28/2014: BUN 13; Creatinine, Ser 1.44; Hemoglobin 16.6; Platelets 186.0; Potassium 4.2;  Sodium 137   Recent Lipid Panel No results found for: CHOL, TRIG, HDL, CHOLHDL, VLDL, LDLCALC, LDLDIRECT  Additional studies/ records that were reviewed today include:    LHC 11/30/14 LM patent LAD prox 95% before stent, 80% after stent LCx irregularities (nothing > 50%) RCA prox to mid stent ok, mid 40-50% L-LAD patent Free Radial-Dx patent EF 45-50%  Myoview 11/13/14 Low risk stress nuclear study with significant artifacts but no ischemia. LV Ejection Fraction: 53%. LV Wall Motion: Paradoxical septal motion.   ASSESSMENT:     1. Precordial pain   2. Coronary artery disease involving native coronary artery of  native heart without angina pectoris   3. Essential hypertension   4. Hyperlipidemia   5. LBBB (left bundle branch block)     PLAN:     In order of problems listed above:  1. Chest pain - He has typical and atypical symptoms. He has chronic LBBB. Cardiac catheterization last year demonstrated patent RCA stents and patent bypass grafts. Myoview prior to that was low risk and negative for ischemia.  He can exert himself without worsening symptoms and nitroglycerin does not seem to improve the symptoms.  When I saw him in 1/17, we planned to do a stress test but he never had this done.  He has now rescheduled it.  He would like to try Ranexa.  This is not unreasonable.   -  Proceed with Lexiscan Myoview  -  Ranexa 500  Mg bid - with bradycardia, would not go to 1000 bid.  -  FU with Dr. Daneen Payne or me in 2-3 weeks.   2. CAD - s/p CABG and multiple PCI procedures to native RCA. Last [facility] 3/16 with patent grafts and patent RCA stents. As noted, Lexiscan Myoview will be obtained due to recurrent chest discomfort.  Try Ranolazine as noted. Continue aspirin, Plavix, statin, amlodipine.   3. HTN - Controlled.  4. HL - Continue statin.  5. LBBB - Old    Medication Adjustments/Labs and Tests Ordered: Current medicines are reviewed at length with the patient today.   Concerns regarding medicines are outlined above.  Medication changes, Labs and Tests ordered today are outlined in the Patient Instructions noted below. Patient Instructions  Medication Instructions:  1. START RANEXA 500 MG 1 TABLET EVERY 12 HOURS; RX SENT TO [facility]  Labwork: NONE  Testing/Procedures: NONE  Follow-Up: DR. Tamala Payne 4 WEEKS; I WILL HAVE DR. Thompson Caul CMA LISA CALL YOU WITH AN APPT  Any Other Special Instructions Will Be Listed Below (If Applicable).  If you need a refill on your cardiac medications before your next appointment, please call your pharmacy.    Signed, Richardson Dopp, PA-C  11/25/2015 5:47 PM    [facility] [place], [place], [place]  [address] Phone: [phone]; Fax: [phone]

## 2015-11-25 ENCOUNTER — Ambulatory Visit (INDEPENDENT_AMBULATORY_CARE_PROVIDER_SITE_OTHER): Payer: BLUE CROSS/BLUE SHIELD | Admitting: Physician Assistant

## 2015-11-25 ENCOUNTER — Encounter: Payer: Self-pay | Admitting: Physician Assistant

## 2015-11-25 ENCOUNTER — Telehealth: Payer: Self-pay

## 2015-11-25 VITALS — BP 130/80 | HR 68 | Ht 68.0 in | Wt 190.1 lb

## 2015-11-25 DIAGNOSIS — R072 Precordial pain: Secondary | ICD-10-CM

## 2015-11-25 DIAGNOSIS — E785 Hyperlipidemia, unspecified: Secondary | ICD-10-CM

## 2015-11-25 DIAGNOSIS — I209 Angina pectoris, unspecified: Secondary | ICD-10-CM | POA: Diagnosis not present

## 2015-11-25 DIAGNOSIS — I2510 Atherosclerotic heart disease of native coronary artery without angina pectoris: Secondary | ICD-10-CM

## 2015-11-25 DIAGNOSIS — I447 Left bundle-branch block, unspecified: Secondary | ICD-10-CM

## 2015-11-25 DIAGNOSIS — I10 Essential (primary) hypertension: Secondary | ICD-10-CM

## 2015-11-25 MED ORDER — RANOLAZINE ER 500 MG PO TB12: 500.0000 mg | ORAL_TABLET | Freq: Two times a day (BID) | ORAL | Status: DC

## 2015-11-25 MED ORDER — NITROGLYCERIN 0.4 MG SL SUBL
0.4000 mg | SUBLINGUAL_TABLET | SUBLINGUAL | Status: DC | PRN
Start: 2015-11-25 — End: 2016-12-22

## 2015-11-25 NOTE — Telephone Encounter (Signed)
Prior auth for Ranexa 500mg  sent to [facility].

## 2015-11-25 NOTE — Patient Instructions (Addendum)
Medication Instructions:  1. START RANEXA 500 MG 1 TABLET EVERY 12 HOURS; RX SENT TO [facility]  Labwork: NONE  Testing/Procedures: NONE  Follow-Up: DR. Tamala Julian 4 WEEKS; I WILL HAVE DR. Thompson Caul CMA LISA CALL YOU WITH AN APPT  Any Other Special Instructions Will Be Listed Below (If Applicable).  If you need a refill on your cardiac medications before your next appointment, please call your pharmacy.

## 2015-11-26 ENCOUNTER — Telehealth: Payer: Self-pay | Admitting: Physician Assistant

## 2015-11-26 ENCOUNTER — Telehealth: Payer: Self-pay

## 2015-11-26 NOTE — Telephone Encounter (Signed)
Ranexa approved by [facility] for 1 year.

## 2015-11-26 NOTE — Telephone Encounter (Signed)
New message     Patient calling the office for samples of medication:   1.  What medication and dosage are you requesting samples for? ranexa 500mg   2.  Are you currently out of this medication? Almost out-----waiting for prior auth to go thru.  Please call if we do not have samples

## 2015-11-26 NOTE — Telephone Encounter (Signed)
Patient informed. Ranexa approved. [facility] pharmacy notified.

## 2015-12-03 ENCOUNTER — Telehealth: Payer: Self-pay

## 2015-12-03 NOTE — Telephone Encounter (Signed)
Dr. Ambrose Pancoast was returning your call. He said that he was busy and would try you back around 2 pm today .   Thanks

## 2015-12-03 NOTE — Telephone Encounter (Signed)
Spoke with pt, He does not have his schedule available. He would prefer to schedule his f/u appt when he comes in for his myoview on 12/10/15. Pt sts that he will schedule his appt at that time with Dr.Smith, he liked Richardson Dopp, [place], and would f/u with him if an appt with Dr.Smith is not available.

## 2015-12-03 NOTE — Telephone Encounter (Signed)
Called pt  to schedule a 4 wk f/u with Dr.Smith per S.Weaver,PA. lmtcb

## 2015-12-04 ENCOUNTER — Telehealth (HOSPITAL_COMMUNITY): Payer: Self-pay | Admitting: *Deleted

## 2015-12-04 NOTE — Telephone Encounter (Signed)
Attempted to call patient regarding upcoming appoint, no answer x1, busy x 2. Hubbard Robinson, RN

## 2015-12-09 ENCOUNTER — Telehealth (HOSPITAL_COMMUNITY): Payer: Self-pay | Admitting: *Deleted

## 2015-12-09 NOTE — Telephone Encounter (Signed)
Patient given detailed instructions per Myocardial Perfusion Study Information Sheet for the test on 12/10/15 at 1000. Patient notified to arrive 15 minutes early and that it is imperative to arrive on time for appointment to keep from having the test rescheduled.  If you need to cancel or reschedule your appointment, please call the office within 24 hours of your appointment. Failure to do so may result in a cancellation of your appointment, and a $50 no show fee. Patient verbalized understanding.Shaakira Borrero, [place]

## 2015-12-10 ENCOUNTER — Ambulatory Visit (HOSPITAL_COMMUNITY): Payer: BLUE CROSS/BLUE SHIELD | Attending: Cardiology

## 2015-12-10 DIAGNOSIS — R0789 Other chest pain: Secondary | ICD-10-CM

## 2015-12-10 DIAGNOSIS — I119 Hypertensive heart disease without heart failure: Secondary | ICD-10-CM | POA: Insufficient documentation

## 2015-12-10 DIAGNOSIS — R9439 Abnormal result of other cardiovascular function study: Secondary | ICD-10-CM | POA: Diagnosis not present

## 2015-12-10 DIAGNOSIS — I447 Left bundle-branch block, unspecified: Secondary | ICD-10-CM | POA: Diagnosis not present

## 2015-12-10 DIAGNOSIS — I2510 Atherosclerotic heart disease of native coronary artery without angina pectoris: Secondary | ICD-10-CM

## 2015-12-10 LAB — MYOCARDIAL PERFUSION IMAGING
CHL CUP NUCLEAR SDS: 1
CHL CUP NUCLEAR SRS: 7
CHL CUP NUCLEAR SSS: 8
CHL CUP RESTING HR STRESS: 50 {beats}/min
LV dias vol: 160 mL (ref 62–150)
LV sys vol: 79 mL
NUC STRESS TID: 1.03
Peak HR: 75 {beats}/min
RATE: 0.31

## 2015-12-10 MED ORDER — TECHNETIUM TC 99M SESTAMIBI GENERIC - CARDIOLITE
10.6000 | Freq: Once | INTRAVENOUS | Status: AC | PRN
  Administered 2015-12-10: 11 via INTRAVENOUS

## 2015-12-10 MED ORDER — REGADENOSON 0.4 MG/5ML IV SOLN
0.4000 mg | Freq: Once | INTRAVENOUS | Status: AC
Start: 2015-12-10 — End: 2015-12-10
  Administered 2015-12-10: 0.4 mg via INTRAVENOUS

## 2015-12-10 MED ORDER — TECHNETIUM TC 99M SESTAMIBI GENERIC - CARDIOLITE
32.8000 | Freq: Once | INTRAVENOUS | Status: AC | PRN
  Administered 2015-12-10: 32.8 via INTRAVENOUS

## 2015-12-13 ENCOUNTER — Telehealth: Payer: Self-pay | Admitting: Physician Assistant

## 2015-12-13 NOTE — Telephone Encounter (Signed)
Pt notified of stress test results by phone with verbal understanding. I also stated PA has asked for Mark Payne to review as well. Pt asked after Dr. Carney Corners test he would like "Mark Payne" Mark Payne to call him if there are to be any changes with treatment. I stated that will let Mark Payne know. Pt said thank you for the call and for the acknowledgement.

## 2015-12-13 NOTE — Telephone Encounter (Signed)
Good evening Dr. Tamala Julian,  I just s/w pt about his stress test results and he asked if you would please call him after you have reviewed test as well and let him know if you have any changes to make in his Tx plan of care.

## 2015-12-13 NOTE — Telephone Encounter (Signed)
Please tell patient his stress test looks like it may be similar to prior stress test. I have asked Dr. Tamala Julian to review it as well. After further review with Dr. Tamala Julian, we will contact him again with our thoughts. Richardson Dopp, PA-C   12/13/2015 3:12 PM

## 2015-12-14 NOTE — Telephone Encounter (Signed)
No change from last year.

## 2015-12-16 NOTE — Telephone Encounter (Signed)
I lmom per Dr. Tamala Julian no changes from last year myoview. I also scheduled pt for 4/13 9:30 w/Scott W. PA same day Dr. Tamala Julian is in the office for his f/u per 3/20 ov note from PA. I asked for ptcb to let confirm appt

## 2015-12-19 ENCOUNTER — Ambulatory Visit: Payer: BLUE CROSS/BLUE SHIELD | Admitting: Physician Assistant

## 2016-01-20 NOTE — Progress Notes (Signed)
Cardiology Office Note:    Date:  01/21/2016   ID:  Mark Payne, DOB Mar 09, 1956, MRN [id number]  PCP:  Tivis Ringer, MD  Cardiologist: Dr. Daneen Schick  Electrophysiologist: n/a  Referring MD: [name], MD   Chief Complaint  Patient presents with  . 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 He underwent CABG in 2005 and subsequent PCI to the RCA x 2 with a DES (2011 and 2015). LHC in 3/16 demonstrated patent bypass grafts and patent RCA stents.   I saw him in 3/17.  He was c/o CP and we arranged a stress test.  His nuclear study demonstrated anteroseptal, apical and inferior lateral defect c/w LBBB artifact vs prior anteroseptal/apical infarct and prior inferior lateral infarct with mild peri-infarct ischemia with EF 51%.  I reviewed this with Dr. Tamala Julian and we felt that the images were similar to his prior study done before his last cath that showed all grafts patent and patent RCA stents.  Therefor, medical Rx was continued.  I started him on Ranexa.  He returns for FU.  He is doing well.  Denies chest pain, dyspnea, syncope, edema. He does need an inguinal hernia repair at some point in the future.    Past Medical History  Diagnosis Date  . LBBB (left bundle branch block) 04/02/2014  . Hyperlipidemia 04/02/2014  . Exertional angina ([place]) 04/02/2014  . Essential hypertension 04/02/2014  . CAD (coronary artery disease) of artery bypass graft 04/02/2014    LIMA to LAD and free radial to diagonal #1 [id number]. PCI of RCA with DES May 2011  The most recent coronary angiogram in 2013 demonstrated a patent LIMA to LAD, patent free radial to diagonal, patent right coronary artery with a patent mid and distal stent. The first of perforator was threatened. The native LAD was subtotally occluded . It was felt that the anatomy was not significantly different than the  prior cath in 2011.     Past Surgical History  Procedure Laterality Date  . Left heart catheterization with coronary/graft angiogram N/A 11/13/2011    Procedure: LEFT HEART CATHETERIZATION WITH Beatrix Fetters;  Surgeon: Sinclair Grooms, MD;  Location: [facility] CATH LAB;  Service: Cardiovascular;  Laterality: N/A;  . Left heart catheterization with coronary/graft angiogram N/A 05/18/2014    Procedure: LEFT HEART CATHETERIZATION WITH Beatrix Fetters;  Surgeon: Sinclair Grooms, MD;  Location: [facility] CATH LAB;  Service: Cardiovascular;  Laterality: N/A;  . Left heart catheterization with coronary/graft angiogram N/A 11/30/2014    Procedure: LEFT HEART CATHETERIZATION WITH Beatrix Fetters;  Surgeon: Belva Crome, MD;  Location: [facility] CATH LAB;  Service: Cardiovascular;  Laterality: N/A;    Current Medications: Outpatient Prescriptions Prior to Visit  Medication Sig Dispense Refill  . amLODipine (NORVASC) 10 MG tablet Take 1 tablet (10 mg total) by mouth daily. 90 tablet 3  . aspirin 81 MG tablet Take 81 mg by mouth daily.    . clopidogrel (PLAVIX) 75 MG tablet TAKE ONE TABLET BY MOUTH ONCE DAILY 30 tablet 6  . co-enzyme Q-10 50 MG capsule Take 50 mg by mouth daily.    . Eszopiclone 3 MG TABS Take 3 mg by mouth daily as needed (FOR SLEEP).   1  . levothyroxine (SYNTHROID, LEVOTHROID) 200 MCG tablet Take 200 mcg by  mouth daily before breakfast.    . Melatonin 3 MG CAPS Take 3 mg by mouth at bedtime as needed (for sleep).    . Multiple Vitamin (MULTI VITAMIN DAILY PO) Take 1 tablet by mouth daily. Patient take 1 tablet by mouth once daily. Patient not sure of dosage.    . nitroGLYCERIN (NITROSTAT) 0.4 MG SL tablet Place 1 tablet (0.4 mg total) under the tongue every 5 (five) minutes as needed for chest pain. 25 tablet 3  . Omega-3 Fatty Acids (FISH OIL PO) Take 1,000 mg by mouth 3 (three) times daily.    . ranolazine (RANEXA) 500 MG 12 hr tablet Take 1 tablet (500 mg total) by mouth  2 (two) times daily. 180 tablet 3  . rosuvastatin (CRESTOR) 20 MG tablet TAKE ONE TABLET BY MOUTH ONCE DAILY 30 tablet 6  . tamsulosin (FLOMAX) 0.4 MG CAPS capsule Take 0.4 mg by mouth daily as needed (for prostate).      No facility-administered medications prior to visit.      Allergies:   Vistaril and Zocor   Social History   Social History  . Marital Status: Married    Spouse Name: N/A  . Number of Children: N/A  . Years of Education: N/A   Occupational History  . physician     Anesthesiologist   Social History Main Topics  . Smoking status: Former Research scientist (life sciences)  . Smokeless tobacco: None  . Alcohol Use: Yes  . Drug Use: No  . Sexual Activity: Yes   Other Topics Concern  . None   Social History Narrative     Family History:  The patient's family history includes Breast cancer in his mother; CAD in his father; Healthy in his brother, brother, and brother; Heart disease in his mother.   ROS:   Please see the history of present illness.    ROS All other systems reviewed and are negative.   Physical Exam:    VS:  BP 120/68 mmHg  Pulse 60  Ht 5\' 8"  (1.727 m)  Wt 185 lb 12.8 oz (84.278 kg)  BMI 28.26 kg/m2   GEN: Well nourished, well developed, in no acute distress HEENT: normal Neck: no JVD, no masses Cardiac: Normal S1/S2, RRR; no murmurs, rubs, or gallops, no edema;     Respiratory:  clear to auscultation bilaterally; no wheezing, rhonchi or rales GI: soft, nontender, nondistended MS: no deformity or atrophy Skin: warm and dry Neuro: No focal deficits  Psych: Alert and oriented x 3, normal affect  Wt Readings from Last 3 Encounters:  01/21/16 185 lb 12.8 oz (84.278 kg)  12/10/15 191 lb (86.637 kg)  11/25/15 190 lb 1.9 oz (86.238 kg)      Studies/Labs Reviewed:     EKG:  EKG is  ordered today.  The ekg ordered today demonstrates  NSR, HR 61, LBBB  Recent Labs: No results found for requested labs within last 365 days.   Recent Lipid Panel No results  found for: CHOL, TRIG, HDL, CHOLHDL, VLDL, LDLCALC, LDLDIRECT  Additional studies/ records that were reviewed today include:   Myoview 12/10/15 Low risk stress nuclear study with large, severe, fixed anteroseptal/apical defect and large, severe, partially reversible inferior lateral defect; findings consistent with probable LBBB artifact vs prior anteroseptal/apical infarct and prior inferior lateral infarct with mild peri-infarct ischemia; EF 51 with mild global hypokinesis and mild LVE.  LHC 11/30/14 LM patent LAD prox 95% before stent, 80% after stent LCx irregularities (nothing > 50%) RCA prox to  mid stent ok, mid 40-50% L-LAD patent Free Radial-Dx patent EF 45-50%  Myoview 11/13/14 Low risk stress nuclear study with significant artifacts but no ischemia. LV Ejection Fraction: 53%. LV Wall Motion: Paradoxical septal motion.  ASSESSMENT:     1. Coronary artery disease involving native coronary artery of native heart without angina pectoris   2. Essential hypertension   3. HLD (hyperlipidemia)   4. LBBB (left bundle branch block)     PLAN:     In order of problems listed above:  1. CAD - s/p CABG and multiple PCI procedures to native RCA. Last [facility] 3/16 with patent grafts and patent RCA stents. Recent nuclear study without significant change.  Medical Rx was recommended.  He feels better on Ranolazine.  Continue Ranolazine.  Continue aspirin, Plavix, statin, amlodipine.   2. HTN - Controlled.  3. HL - Continue statin for now.  He is concerned about memory issues. We discussed the low side effect profile for PCSK-9 inhibitors.  He would like to look into taking one of these drugs.  Refer to lipid clinic.    4. LBBB - Old   Medication Adjustments/Labs and Tests Ordered: Current medicines are reviewed at length with the patient today.  Concerns regarding medicines are outlined above.  Medication changes, Labs and Tests ordered today are outlined in the Patient Instructions  noted below. Patient Instructions  Medication Instructions:  Your physician recommends that you continue on your current medications as directed. Please refer to the Current Medication list given to you today. Labwork: NONE Testing/Procedures: NONE Follow-Up: 1. YOU ARE BEING REFERRED TO THE LIPID CLINIC TO DISCUSS PCSK-9 2. Your physician wants you to follow-up in: [place]. Gaspar Bidding will receive a reminder letter in the mail two months in advance. If you don't receive a letter, please call our office to schedule the follow-up appointment. Any Other Special Instructions Will Be Listed Below (If Applicable). If you need a refill on your cardiac medications before your next appointment, please call your pharmacy.   Signed, Richardson Dopp, PA-C  01/21/2016 4:49 PM    [facility] [address], [place], [place]  [address] Phone: [phone]; Fax: [phone]

## 2016-01-21 ENCOUNTER — Encounter: Payer: Self-pay | Admitting: Physician Assistant

## 2016-01-21 ENCOUNTER — Ambulatory Visit (INDEPENDENT_AMBULATORY_CARE_PROVIDER_SITE_OTHER): Payer: BLUE CROSS/BLUE SHIELD | Admitting: Physician Assistant

## 2016-01-21 VITALS — BP 120/68 | HR 60 | Ht 68.0 in | Wt 185.8 lb

## 2016-01-21 DIAGNOSIS — I447 Left bundle-branch block, unspecified: Secondary | ICD-10-CM | POA: Diagnosis not present

## 2016-01-21 DIAGNOSIS — I2510 Atherosclerotic heart disease of native coronary artery without angina pectoris: Secondary | ICD-10-CM

## 2016-01-21 DIAGNOSIS — I10 Essential (primary) hypertension: Secondary | ICD-10-CM | POA: Diagnosis not present

## 2016-01-21 DIAGNOSIS — E785 Hyperlipidemia, unspecified: Secondary | ICD-10-CM | POA: Diagnosis not present

## 2016-01-21 NOTE — Patient Instructions (Addendum)
Medication Instructions:  Your physician recommends that you continue on your current medications as directed. Please refer to the Current Medication list given to you today. Labwork: NONE Testing/Procedures: NONE Follow-Up: 1. YOU ARE BEING REFERRED TO THE LIPID CLINIC TO DISCUSS PCSK-9 2. Your physician wants you to follow-up in: [place]. Mark Payne will receive a reminder letter in the mail two months in advance. If you don't receive a letter, please call our office to schedule the follow-up appointment. Any Other Special Instructions Will Be Listed Below (If Applicable). If you need a refill on your cardiac medications before your next appointment, please call your pharmacy.

## 2016-02-05 ENCOUNTER — Other Ambulatory Visit: Payer: Self-pay | Admitting: Interventional Cardiology

## 2016-02-07 ENCOUNTER — Ambulatory Visit (INDEPENDENT_AMBULATORY_CARE_PROVIDER_SITE_OTHER): Payer: BLUE CROSS/BLUE SHIELD | Admitting: Pharmacist

## 2016-02-07 DIAGNOSIS — E785 Hyperlipidemia, unspecified: Secondary | ICD-10-CM | POA: Diagnosis not present

## 2016-02-07 LAB — LIPID PANEL
CHOL/HDL RATIO: 2.7 ratio (ref <=5.0)
CHOLESTEROL: 155 mg/dL (ref 125–200)
HDL: 58 mg/dL (ref >=40)
LDL CALC: 78 mg/dL (ref <130)
Triglycerides: 94 mg/dL (ref <150)
VLDL: 19 mg/dL (ref <30)

## 2016-02-07 MED ORDER — EZETIMIBE 10 MG PO TABS: 10.0000 mg | ORAL_TABLET | Freq: Every day | ORAL | Status: AC

## 2016-02-07 NOTE — Progress Notes (Signed)
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 PMH is significant for CAD s/p CABG, HTN, HLD, and LBBB. Pt underwent CABG in 2005 with subsequent PCI to RCA x2 with DES (2011 and 2015). Pt is concerned about memory issue with statins and presents to lipid clinic today to discuss other options.  Patient reports that he has some trouble with name recall that is improved when he does not take statins. He is still taking Crestor 20mg  daily. He experienced myalgias with Lipitor and cranial nerve palsy with simvastatin that was reproducible with rechallenge.  Current Medications: Crestor 20mg  daily - has been on it a few years Intolerances: Lipitor (myalgias - legs and back), Mevacor, Zocor (cranial nerve palsy - reproducible after drug d/c and restart multiple times) Risk Factors: CAD s/p CABG and PCI - pt reports 5 stents, angina LDL goal: < 70mg /dL  Diet: Avoids meat and fast food.  Exercise: Runs frequently.  Family History: Mother with heart disease and breast cancer, father with CAD.  Social History: Patient is a former smoker, drinks alcohol occasionally, and does not use illicit drugs.  Labs: none on file. Will check lipid panel today.  Past Medical History  Diagnosis Date  . LBBB (left bundle branch block) 04/02/2014  . Hyperlipidemia 04/02/2014  . Exertional angina ([place]) 04/02/2014  . Essential hypertension 04/02/2014  . CAD (coronary artery disease) of artery bypass graft 04/02/2014    LIMA to LAD and free radial to diagonal #1 20075. PCI of RCA with DES May 2011  The most recent coronary angiogram in 2013 demonstrated a patent LIMA to LAD, patent free radial to diagonal, patent right coronary artery with a patent mid and distal stent. The first of perforator was threatened. The native LAD was subtotally occluded . It was felt that the  anatomy was not significantly different than the prior cath in 2011.     Current Outpatient Prescriptions on File Prior to Visit  Medication Sig Dispense Refill  . amLODipine (NORVASC) 10 MG tablet TAKE 1 TABLET BY MOUTH DAILY 180 tablet 3  . aspirin 81 MG tablet Take 81 mg by mouth daily.    . clopidogrel (PLAVIX) 75 MG tablet TAKE 1 TABLET BY MOUTH EVERY DAY 90 tablet 3  . co-enzyme Q-10 50 MG capsule Take 50 mg by mouth daily.    . Eszopiclone 3 MG TABS Take 3 mg by mouth daily as needed (FOR SLEEP).   1  . levothyroxine (SYNTHROID, LEVOTHROID) 200 MCG tablet Take 200 mcg by mouth daily before breakfast.    . Melatonin 3 MG CAPS Take 3 mg by mouth at bedtime as needed (for sleep).    . Multiple Vitamin (MULTI VITAMIN DAILY PO) Take 1 tablet by mouth daily. Patient take 1 tablet by mouth once daily. Patient not sure of dosage.    . nitroGLYCERIN (NITROSTAT) 0.4 MG SL tablet Place 1 tablet (0.4 mg total) under the tongue every 5 (five) minutes as needed for chest pain. 25 tablet 3  . Omega-3 Fatty Acids (FISH OIL PO) Take 1,000 mg by mouth 3 (three) times daily.    . ranolazine (RANEXA) 500 MG 12 hr tablet Take  1 tablet (500 mg total) by mouth 2 (two) times daily. 180 tablet 3  . rosuvastatin (CRESTOR) 20 MG tablet TAKE ONE TABLET BY MOUTH ONCE DAILY 30 tablet 6  . tamsulosin (FLOMAX) 0.4 MG CAPS capsule Take 0.4 mg by mouth daily as needed (for prostate).      No current facility-administered medications on file prior to visit.    Allergies  Allergen Reactions  . Vistaril [Hydroxyzine Hcl]     PATIENT HAD NAUSEA IT MADE HIM REALLY SICK  . Zocor [Simvastatin]     PATIENT HAD BLURRED VISION     Assessment/Plan:  1. Hyperlipidemia - Checking lipid panel today since no labs are on file in Epic. LDL goal is < 70 given history of CAD and multiple stents. Pt is agreeable to continuing Crestor 20mg  daily. Will add Zetia 10mg  daily and plan to recheck lipids in 2 months. If LDL still  elevated above goal, will pursue PCSK9i at that time.   Mark Payne, PharmD, [facility] [id number] [place], [place] [address] Phone: [phone]; Fax: [phone] 02/07/2016 10:42 AM

## 2016-02-07 NOTE — Patient Instructions (Signed)
Continue taking your Crestor 20mg  daily.  Start taking Zetia (ezetimibe) 10mg  once daily.  We will recheck your cholesterol in 2 months.

## 2016-02-11 ENCOUNTER — Telehealth: Payer: Self-pay | Admitting: Pharmacist

## 2016-02-11 ENCOUNTER — Other Ambulatory Visit: Payer: Self-pay | Admitting: Pharmacist

## 2016-02-11 NOTE — Telephone Encounter (Signed)
Informed pt of lipid panel results from 02/07/16 lipid clinic visit. Pt states he has been taking Crestor 10mg  not 20mg  daily. Med list updated. See lab results 6/2 for details.

## 2016-03-19 IMAGING — CR DG CHEST 2V
2 series · 2 of 2 positions shown · non-contrast
Comparison: 06/18/2008

CLINICAL DATA: Preop for cardiac catheterization

EXAM:
CHEST  2 VIEW

[w chest pa]
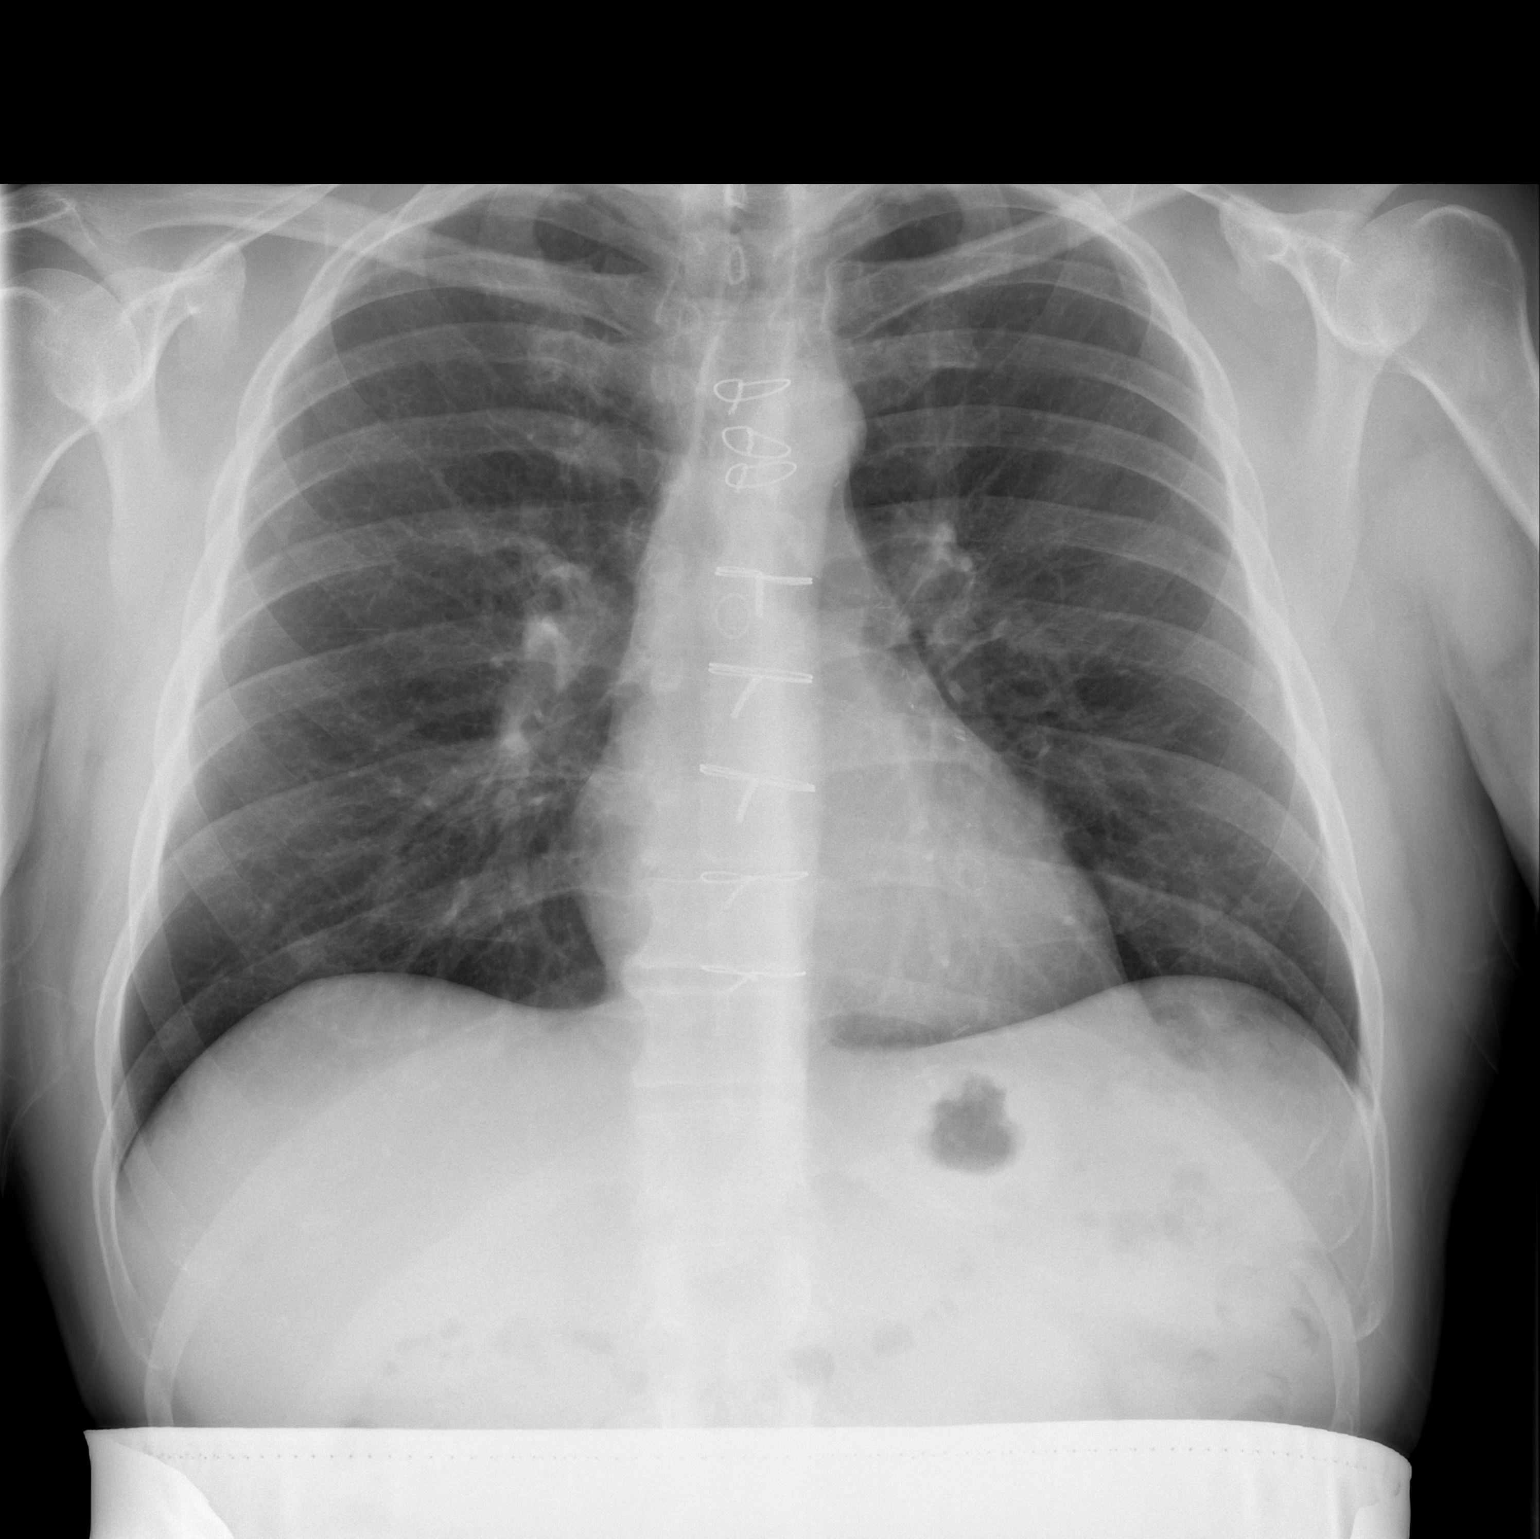

[w chest lat]
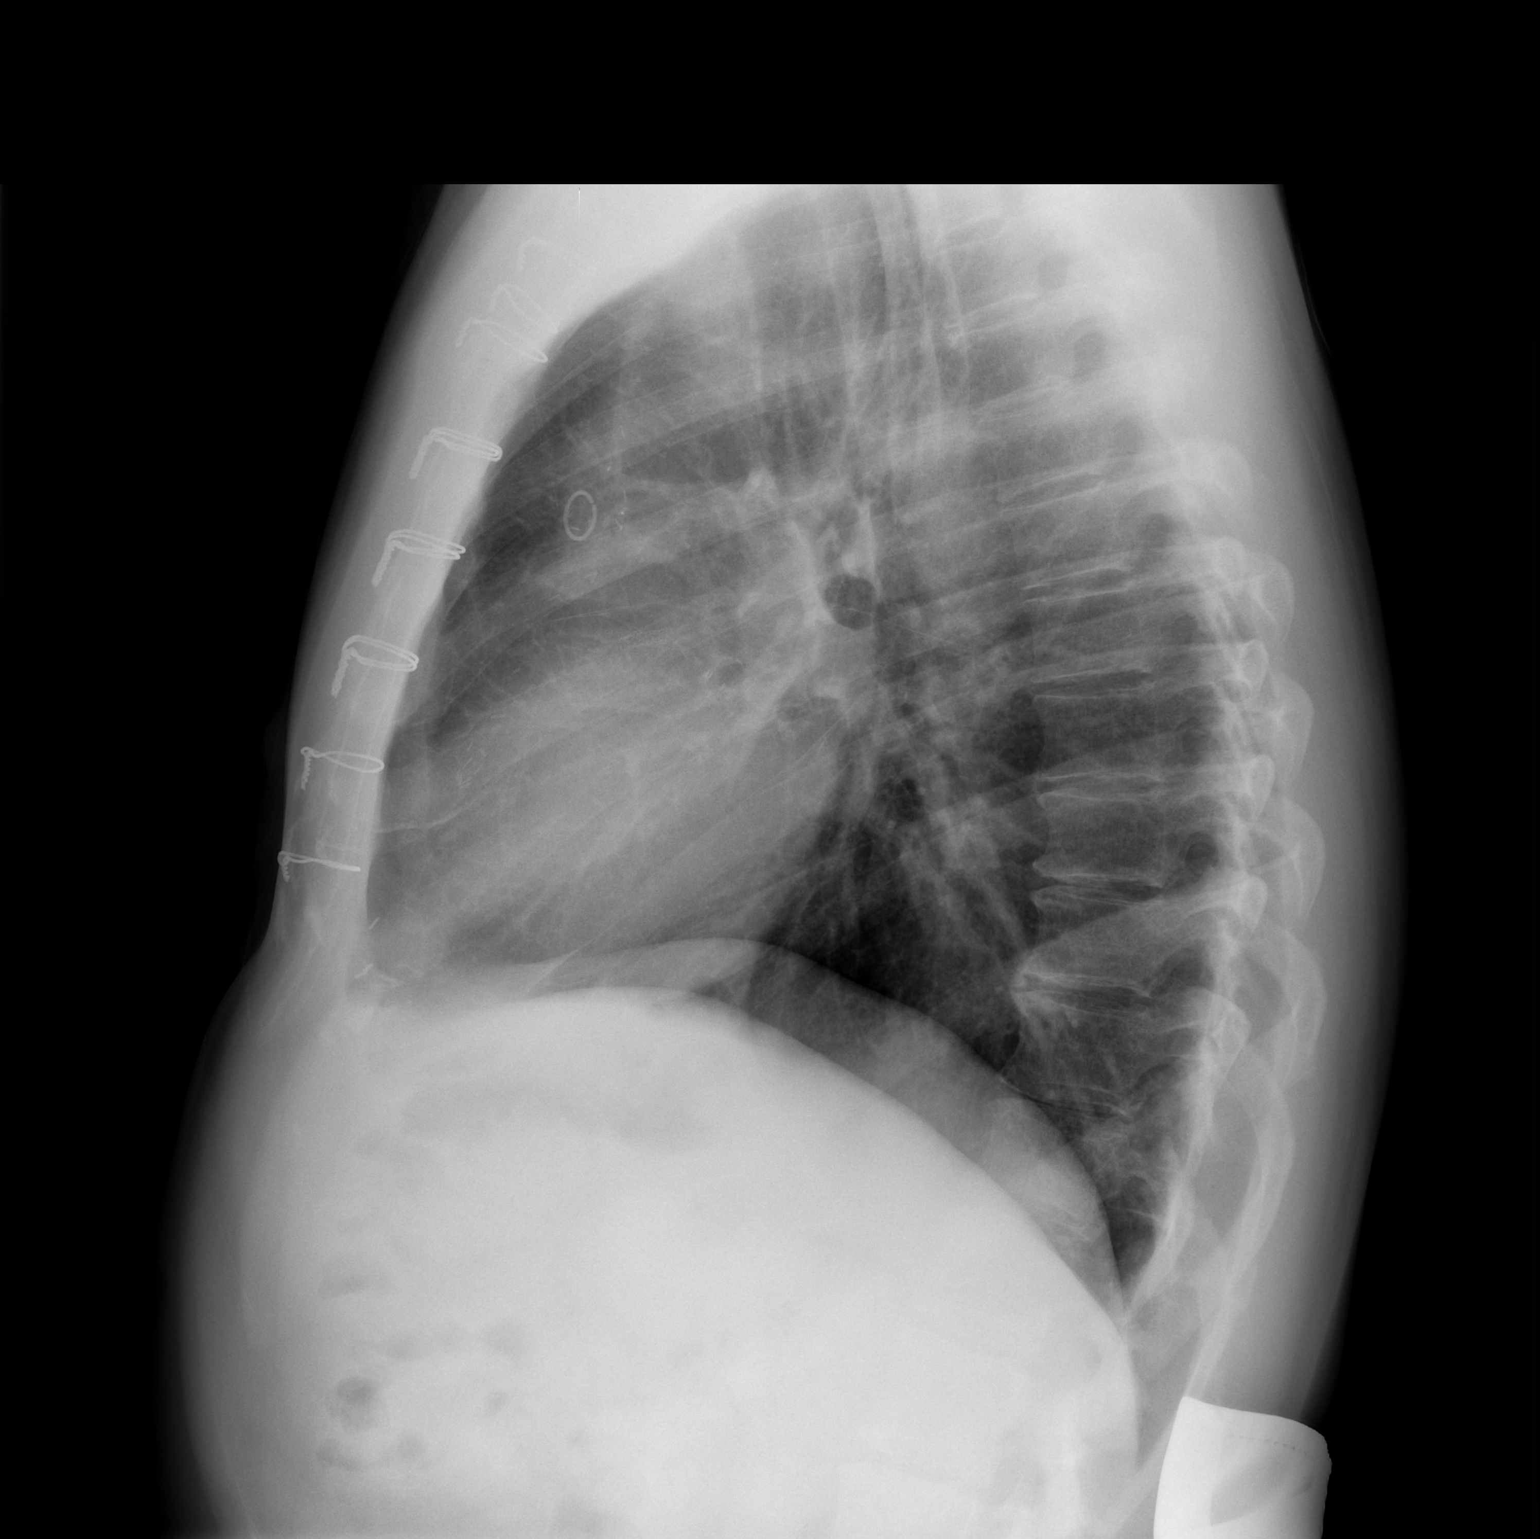

[2 of 2 positions shown; findings below may reference images not displayed]

FINDINGS: There is no focal parenchymal opacity, pleural effusion, or
pneumothorax. The heart and mediastinal contours are unremarkable.
There is evidence of prior CABG.

The osseous structures are unremarkable.
IMPRESSION: No active cardiopulmonary disease.

## 2016-04-03 ENCOUNTER — Other Ambulatory Visit: Payer: 59 | Admitting: *Deleted

## 2016-04-03 DIAGNOSIS — E785 Hyperlipidemia, unspecified: Secondary | ICD-10-CM

## 2016-04-03 LAB — HEPATIC FUNCTION PANEL
ALBUMIN: 4.6 g/dL (ref 3.6–5.1)
ALK PHOS: 70 U/L (ref 40–115)
ALT: 30 U/L (ref 9–46)
AST: 27 U/L (ref 10–35)
BILIRUBIN TOTAL: 0.8 mg/dL (ref 0.2–1.2)
Bilirubin, Direct: 0.2 mg/dL (ref <=0.2)
Indirect Bilirubin: 0.6 mg/dL (ref 0.2–1.2)
TOTAL PROTEIN: 6.9 g/dL (ref 6.1–8.1)

## 2016-04-03 LAB — LIPID PANEL
CHOL/HDL RATIO: 2.4 ratio (ref <=5.0)
Cholesterol: 133 mg/dL (ref 125–200)
HDL: 56 mg/dL (ref >=40)
LDL CALC: 55 mg/dL (ref <130)
TRIGLYCERIDES: 109 mg/dL (ref <150)
VLDL: 22 mg/dL (ref <30)

## 2016-04-08 ENCOUNTER — Other Ambulatory Visit: Payer: BLUE CROSS/BLUE SHIELD

## 2016-04-09 ENCOUNTER — Ambulatory Visit: Payer: BLUE CROSS/BLUE SHIELD

## 2016-04-10 ENCOUNTER — Ambulatory Visit: Payer: BLUE CROSS/BLUE SHIELD | Admitting: Pharmacist

## 2016-07-06 ENCOUNTER — Other Ambulatory Visit: Payer: Self-pay | Admitting: Interventional Cardiology

## 2016-12-07 ENCOUNTER — Encounter: Payer: Self-pay | Admitting: Physician Assistant

## 2016-12-11 ENCOUNTER — Telehealth: Payer: Self-pay | Admitting: *Deleted

## 2016-12-11 DIAGNOSIS — Z01818 Encounter for other preprocedural examination: Secondary | ICD-10-CM

## 2016-12-11 NOTE — Telephone Encounter (Signed)
Last stress test in 12/2015 was ok. If the surgeon requested an echocardiogram, ok to get before appointment. No PCI since 2015.  If he is not having any symptoms of chest pain or change in his symptoms, it will likely be ok to hold Plavix 5-7 days prior to his surgery. Richardson Dopp, PA-C   12/11/2016 1:45 PM

## 2016-12-11 NOTE — Addendum Note (Signed)
Addended by: Michae Kava on: 12/11/2016 02:08 PM   Modules accepted: Orders

## 2016-12-11 NOTE — Telephone Encounter (Signed)
Dr. Ambrose Pancoast called this morning to discuss with Mark Payne, Mark Payne before his upcoming appt 12/22/16 with the Mark. Dr. Ambrose Pancoast states his appt 12/22/16 is a pre op appt for an upcoming surgery for b/l Inguinal Hernia surgery. Pt states he needs to know instructions for Plavix which he said can be discussed at the [facility] 4/17, though he does state that he knows the surgeon is going to want an echo to be done; pt states he has neve had an echo to the best of his knowledge. Pt is requesting to try and have the echo done before his appt 12/22/16 with Mark. Pt is hoping to schedule his surgery for around the week of 12/28/16. I advised pt that I will d/w Mark Payne, [facility] for further advice and we will call back today. Pt asked to call [phone]. Pt thanked me for taking the time to s/w him and to see what we can do to expedite thing s for his surgery.

## 2016-12-11 NOTE — Telephone Encounter (Signed)
I s/w pt (Dr. Ambrose Pancoast), advised ok per Richardson Dopp, PA ok to do Echo before his appt on 4/17. Pt is needing echo for pre-op clearance. Order placed in Epic today. Pt is scheduled for 12/14/16 1 pm at [employer] office, though pt would like to see if we can do something as late as possible. I advised we may have to look at the [facility] for echo. I did offer 4/19 4 pm, which is after his 4/17 appt, though pt is trying to get his surgery scheduled for 12/28/16. I advised I will have [facility] call him today to see if we can later time maybe at [facility]. Pt thanked me for my time and all of our help.

## 2016-12-12 ENCOUNTER — Other Ambulatory Visit: Payer: Self-pay | Admitting: Physician Assistant

## 2016-12-14 ENCOUNTER — Ambulatory Visit (HOSPITAL_COMMUNITY): Payer: 59 | Attending: Cardiovascular Disease

## 2016-12-14 ENCOUNTER — Telehealth: Payer: Self-pay | Admitting: *Deleted

## 2016-12-14 ENCOUNTER — Ambulatory Visit: Payer: 59 | Admitting: Physician Assistant

## 2016-12-14 ENCOUNTER — Encounter: Payer: Self-pay | Admitting: Physician Assistant

## 2016-12-14 DIAGNOSIS — Z87891 Personal history of nicotine dependence: Secondary | ICD-10-CM | POA: Insufficient documentation

## 2016-12-14 DIAGNOSIS — Z01818 Encounter for other preprocedural examination: Secondary | ICD-10-CM | POA: Diagnosis not present

## 2016-12-14 DIAGNOSIS — I2510 Atherosclerotic heart disease of native coronary artery without angina pectoris: Secondary | ICD-10-CM | POA: Insufficient documentation

## 2016-12-14 DIAGNOSIS — Z951 Presence of aortocoronary bypass graft: Secondary | ICD-10-CM | POA: Diagnosis not present

## 2016-12-14 DIAGNOSIS — I10 Essential (primary) hypertension: Secondary | ICD-10-CM | POA: Diagnosis not present

## 2016-12-14 NOTE — Telephone Encounter (Signed)
Dr. Ambrose Pancoast (pt) has been notified of echo results by phone with verbal understanding .

## 2016-12-14 NOTE — Telephone Encounter (Signed)
Pt notified of Echo results by phone with verbal understanding.

## 2016-12-14 NOTE — Telephone Encounter (Signed)
-----   Message from Liliane Shi, [place] sent at 12/14/2016  5:44 PM EDT ----- Please call the patient. His echocardiogram is normal with normal LV function (EF 56%).  Please fax a copy of this study result to his PCP:  Tivis Ringer, MD  Thanks! Richardson Dopp, PA-C    12/14/2016 5:42 PM

## 2016-12-22 ENCOUNTER — Encounter (INDEPENDENT_AMBULATORY_CARE_PROVIDER_SITE_OTHER): Payer: Self-pay

## 2016-12-22 ENCOUNTER — Telehealth: Payer: Self-pay | Admitting: Pharmacist

## 2016-12-22 ENCOUNTER — Encounter: Payer: Self-pay | Admitting: Physician Assistant

## 2016-12-22 ENCOUNTER — Ambulatory Visit: Payer: 59

## 2016-12-22 ENCOUNTER — Other Ambulatory Visit: Payer: Self-pay | Admitting: Physician Assistant

## 2016-12-22 ENCOUNTER — Other Ambulatory Visit: Payer: 59

## 2016-12-22 ENCOUNTER — Ambulatory Visit (INDEPENDENT_AMBULATORY_CARE_PROVIDER_SITE_OTHER): Payer: 59 | Admitting: Physician Assistant

## 2016-12-22 VITALS — BP 132/80 | HR 54 | Ht 68.0 in | Wt 190.0 lb

## 2016-12-22 DIAGNOSIS — E785 Hyperlipidemia, unspecified: Secondary | ICD-10-CM

## 2016-12-22 DIAGNOSIS — I10 Essential (primary) hypertension: Secondary | ICD-10-CM | POA: Diagnosis not present

## 2016-12-22 DIAGNOSIS — Z01810 Encounter for preprocedural cardiovascular examination: Secondary | ICD-10-CM | POA: Diagnosis not present

## 2016-12-22 DIAGNOSIS — I2510 Atherosclerotic heart disease of native coronary artery without angina pectoris: Secondary | ICD-10-CM | POA: Diagnosis not present

## 2016-12-22 NOTE — Progress Notes (Signed)
Cardiology Office Note:    Date:  12/22/2016   ID:  JENNER ROSIER, DOB [birth year], MRN [id number]  PCP:  Tivis Ringer, MD  Cardiologist:  Dr. Daneen Schick   Electrophysiologist:  n/a  Referring MD: Ralene Ok, MD   Chief Complaint  Patient presents with  . 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   He has a hx of CAD status post CABG, HTN, HL, LBBB. He underwent CABG in 2005 and subsequent PCI to the RCA x 2 with a DES (2011 and 2015). LHC in 3/16 demonstrated patent bypass grafts and patent RCA stents. Nuclear stress test in 4/17 demonstrated anteroseptal, apical, inferior lateral defect consistent with LBBB artifact versus prior anteroseptal/apical infarct and prior inferior lateral infarct with mild peri-infarct ischemia, EF 51. It was felt that his study demonstrated similar findings to his prior evaluations. Medical therapy was continued. He had suspected angina that was previously controlled on amlodipine and ranolazine.  He is here alone.  His BP was doing well and he stopped Amlodipine. He also stopped Ranolazine.  He has not had any anginal symptoms since.  He has occ dyspepsia.  He denies orthopnea, PND, edema, shortness of breath, syncope.    Prior CV studies:   The following studies were reviewed today:  Echo 12/14/16 Normal LV function, normal wall motion  Myoview 12/10/15 Low risk stress nuclear study with large, severe, fixed anteroseptal/apical defect and large, severe, partially reversible inferior lateral defect; findings consistent with probable LBBB artifact vs prior anteroseptal/apical infarct and prior inferior lateral infarct with mild peri-infarct ischemia; EF 51 with mild global hypokinesis and mild LVE.   LHC 11/30/14 LM patent LAD prox 95% before stent, 80% after stent LCx  irregularities (nothing > 50%) RCA prox to mid stent ok, mid 40-50% L-LAD patent Free Radial-Dx patent EF 45-50%   Myoview 11/13/14 Low risk stress nuclear study with significant artifacts but no ischemia.  LV Ejection Fraction: 53%.  LV Wall Motion:  Paradoxical septal motion.   Past Medical History:  Diagnosis Date  . Coronary artery disease involving native coronary artery with angina pectoris ([place]) 04/02/2014   LIMA to LAD and free radial to diagonal #[id number] // s/p PCI of RCA with DES May 2011 and DES to RCA 05/18/14 // MV 11/13/14: Low risk with significant artifacts but no ischemia.  EF 53 // LHC 3/16: pLAD 95, 80; LCx irregs; pRCA stents ok, mRCA 40-50, L-LAD ok, free Rad-Dx ok, EF 45-50 // MV 4/17: ant-sept, apical defect, partially reversible inf-lat defect, EF 51 (no change from prior) >> Med Rx.   . Essential hypertension 04/02/2014  . History of echocardiogram    Echo 4/18: normal LV systolic function (EF 56), no RWMA  . Hyperlipidemia 04/02/2014  . LBBB (left bundle branch block) 04/02/2014    Past Surgical History:  Procedure Laterality Date  . LEFT HEART CATHETERIZATION WITH CORONARY/GRAFT ANGIOGRAM N/A 11/13/2011   Procedure: LEFT HEART CATHETERIZATION WITH Beatrix Fetters;  Surgeon: Sinclair Grooms, MD;  Location[facility] C[facility] LAB;  Service: Cardiovascular;  Laterality: N/A;  . LEFT HEART CATHETERIZATION WITH CORONARY/GRAFT ANGIOGRAM N/A 05/18/2014   Procedure: LEFT HEART CATHETERIZATION WITH Beatrix Fetters;  Surgeon: Sinclair Grooms, MD;  Location: [facility] CATH LAB;  Service: Cardiovascular;  Laterality: N/A;  . LEFT HEART  CATHETERIZATION WITH CORONARY/GRAFT ANGIOGRAM N/A 11/30/2014   Procedure: LEFT HEART CATHETERIZATION WITH Beatrix Fetters;  Surgeon: Belva Crome, MD;  Location: [facility] CATH LAB;  Service: Cardiovascular;  Laterality: N/A;    Current Medications: Current Meds  Medication Sig  . aspirin 81 MG tablet Take 81 mg by mouth daily.  .  clopidogrel (PLAVIX) 75 MG tablet TAKE 1 TABLET BY MOUTH EVERY DAY  . ezetimibe (ZETIA) 10 MG tablet Take 1 tablet (10 mg total) by mouth daily.  Marland Kitchen levothyroxine (SYNTHROID, LEVOTHROID) 200 MCG tablet Take 200 mcg by mouth daily before breakfast.  . rosuvastatin (CRESTOR) 10 MG tablet Take 1 tablet (10 mg total) by mouth daily.     Allergies:   Vistaril [hydroxyzine hcl] and Zocor [simvastatin]   Social History   Social History  . Marital status: Married    Spouse name: N/A  . Number of children: N/A  . Years of education: N/A   Occupational History  . physician     Anesthesiologist   Social History Main Topics  . Smoking status: Former Research scientist (life sciences)  . Smokeless tobacco: Never Used  . Alcohol use Yes  . Drug use: No  . Sexual activity: Yes   Other Topics Concern  . None   Social History Narrative  . None     Family History  Problem Relation Age of Onset  . Heart disease Mother   . Breast cancer Mother   . CAD Father   . Healthy Brother   . Healthy Brother   . Healthy Brother      ROS:   Please see the history of present illness.    ROS All other systems reviewed and are negative.   EKGs/Labs/Other Test Reviewed:    EKG:  EKG is   ordered today.  The ekg ordered today demonstrates sinus brady, HR 54, LBBB, PR 216 ms  Recent Labs: 04/03/2016: ALT 30   Recent Lipid Panel    Component Value Date/Time   CHOL 133 04/03/2016 1230   TRIG 109 04/03/2016 1230   HDL 56 04/03/2016 1230   CHOLHDL 2.4 04/03/2016 1230   VLDL 22 04/03/2016 1230   LDLCALC 55 04/03/2016 1230     Physical Exam:    VS:  BP 132/80   Pulse (!) 54   Ht 5\' 8"  (1.727 m)   Wt 190 lb (86.2 kg)   BMI 28.89 kg/m     Wt Readings from Last 3 Encounters:  12/22/16 190 lb (86.2 kg)  01/21/16 185 lb 12.8 oz (84.3 kg)  12/10/15 191 lb (86.6 kg)     Physical Exam  Constitutional: He is oriented to person, place, and time. He appears well-developed and well-nourished. No distress.  HENT:    Head: Normocephalic and atraumatic.  Eyes: No scleral icterus.  Neck: Normal range of motion. No JVD present.  Cardiovascular: Normal rate, regular rhythm, S1 normal and S2 normal.   No murmur heard. Pulmonary/Chest: Effort normal and breath sounds normal. He has no wheezes. He has no rhonchi. He has no rales.  Abdominal: Soft. There is no tenderness.  Musculoskeletal: He exhibits no edema.  Neurological: He is alert and oriented to person, place, and time.  Skin: Skin is warm and dry.  Psychiatric: He has a normal mood and affect.    ASSESSMENT:    1. Preoperative cardiovascular examination   2. Coronary artery disease involving native coronary artery of native heart without angina pectoris   3. Essential hypertension   4. Hyperlipidemia, unspecified  hyperlipidemia type    PLAN:    In order of problems listed above:  1. Preoperative cardiovascular examination - The patient does not have any unstable cardiac conditions.  Upon evaluation today, he can achieve 4 METs or greater without anginal symptoms.  According to [facility] and AHA guidelines, he requires no further cardiac workup prior to his noncardiac surgery and should be at acceptable risk.  Our service is available as necessary in the perioperative period. It is ok to hold Plavix 5-7 days prior to surgery and resume afterward.  He should remain on ASA throughout the perioperative period if bleeding risk is low enough.    2. Coronary artery disease involving native coronary artery of native heart without angina pectoris - Plan: EKG 12-Lead He is s/p CABG in 2005 and subsequent PCI with DES to the RCA in 2011 and 2015.  He had a heart cath in 2016 with patent grafts and patent stents.  His myoview in 2017 was low risk with similar findings to previous studies.  He has not had any anginal symptoms off Amlodipine or Ranolazine.  Continue ASA, Plavix, statin.   3. Essential hypertension - BP at target off of medication.  Continue current  Rx.   4. Hyperlipidemia, unspecified hyperlipidemia type - Continue statin, ezetimibe.  FU with Lipid Clinic as planned.    Dispo:  Return in about 6 months (around 06/23/2017) for Routine Follow Up with Dr. Tamala Julian or Richardson Dopp, PA-C.   Medication Adjustments/Labs and Tests Ordered: Current medicines are reviewed at length with the patient today.  Concerns regarding medicines are outlined above.  Medication changes, Labs and Tests ordered today are outlined in the Patient Instructions noted below. Patient Instructions  Medication Instructions:  Your physician recommends that you continue on your current medications as directed. Please refer to the Current Medication list given to you today.  Labwork: NONE ORDERED  Testing/Procedures: NONE ORDERED  Follow-Up: Your physician wants you to follow-up in: [place] DR. Gaspar Bidding will receive a reminder letter in the mail two months in advance. If you don't receive a letter, please call our office to schedule the follow-up appointment.  Any Other Special Instructions Will Be Listed Below (If Applicable).  If you need a refill on your cardiac medications before your next appointment, please call your pharmacy.  Signed, Richardson Dopp, PA-C  12/22/2016 3:37 PM    [facility] [address], [place], [place]  [address] Phone: [phone]; Fax: [phone]

## 2016-12-22 NOTE — Patient Instructions (Addendum)
Medication Instructions:  Your physician recommends that you continue on your current medications as directed. Please refer to the Current Medication list given to you today.  Labwork: NONE ORDERED  Testing/Procedures: NONE ORDERED  Follow-Up: Your physician wants you to follow-up in: [place] DR. Gaspar Bidding will receive a reminder letter in the mail two months in advance. If you don't receive a letter, please call our office to schedule the follow-up appointment.  Any Other Special Instructions Will Be Listed Below (If Applicable).  If you need a refill on your cardiac medications before your next appointment, please call your pharmacy.

## 2016-12-22 NOTE — Telephone Encounter (Signed)
Pt scheduled for lipid visit this afternoon prior to office visit with Richardson Dopp. Called pt to discuss since his most recent lipid panel showed LDL of 53 at goal < 70 on Crestor 10mg  daily and Zetia 10mg  daily.  Pt is hoping that he can decrease his Crestor dose because he thinks it is causing slight memory issues. Advised him that we can check his lipids today to see how his LDL is looking prior to making a decision. Added on direct LDL and lipid panel since pt will not be fasting this afternoon.

## 2016-12-22 NOTE — Telephone Encounter (Signed)
Ok. Thank you.

## 2016-12-23 LAB — LIPID PANEL
CHOLESTEROL TOTAL: 116 mg/dL (ref 100–199)
Chol/HDL Ratio: 2.5 ratio (ref 0.0–5.0)
HDL: 46 mg/dL (ref >39)
LDL CALC: 47 mg/dL (ref 0–99)
TRIGLYCERIDES: 117 mg/dL (ref 0–149)
VLDL Cholesterol Cal: 23 mg/dL (ref 5–40)

## 2016-12-23 LAB — LDL CHOLESTEROL, DIRECT: LDL Direct: 58 mg/dL (ref 0–99)

## 2016-12-23 NOTE — Progress Notes (Signed)
Thank you Tamber Burtch! 

## 2016-12-23 NOTE — Telephone Encounter (Signed)
Spoke with pt regarding lipid panel results. LDL is at goal < 70 given hx of ASCVD. Pt had inquired about reducing his Crestor dose from 10mg  to 5mg  over fears of memory issues. He denies any current memory problems on Crestor and states that he previously experienced some while taking Lipitor. Discussed that side effects tend to be dose and statin related; pt is taking a low dose of Crestor and Crestor itself is less likely to be associated with memory issues compared to Lipitor. Discussed that his LDL will likely rise above goal if we reduce his Crestor dose. He is willing to continue taking Crestor 10mg  daily in addition to Zetia 10mg  daily.

## 2016-12-23 NOTE — Progress Notes (Signed)
Agree with assessment and plan 

## 2017-04-28 ENCOUNTER — Telehealth: Payer: Self-pay | Admitting: Interventional Cardiology

## 2017-04-28 DIAGNOSIS — I25119 Atherosclerotic heart disease of native coronary artery with unspecified angina pectoris: Secondary | ICD-10-CM

## 2017-04-28 NOTE — Telephone Encounter (Signed)
New message      Pt c/o of Chest Pain: STAT if CP now or developed within 24 hours  1. Are you having CP right now?  No--(chest pressure--no pain) 2. Are you experiencing any other symptoms (ex. SOB, nausea, vomiting, sweating)? sob 3. How long have you been experiencing CP?  For 1-2 weeks 4. Is your CP continuous or coming and going?  Comes and goes 5. Have you taken Nitroglycerin? No Dr Ambrose Pancoast says he thinks Dr Tamala Julian wanted him to have a stress test. However, there is no order in the computer.  Please call and let him know what Dr Tamala Julian want to do?

## 2017-04-28 NOTE — Telephone Encounter (Signed)
Called and spoke to patient who states that he has been having intermittent chest pain for the past couple of weeks. Patient denies having any chest pain at this time. Patient states that he wants to schedule a stress test. Patient states that he discussed his symptoms in detail and reviewed images with Dr. Tamala Julian today while in the hospital and Dr. Tamala Julian said that he could do a stress test. Patient made aware that Dr. Tamala Julian has not communicated that to [place] yet and that in order to schedule anything we need to have the okay to put the order in. Made patient aware that a message would be sent to Dr. Tamala Julian and that he would be back in the office tomorrow and that someone would call him back tomorrow to set up. Patient verbalized understanding and thanked me for the call.

## 2017-04-28 NOTE — Telephone Encounter (Signed)
Please schedule for stress Myoview due to increasing angina in setting of CAD and prior CABG.

## 2017-04-29 NOTE — Telephone Encounter (Signed)
Spoke with pt and went over recommendations and instructions for myoview per Dr. Tamala Julian.  Pt verbalized understanding and was in agreement with this plan.  Advised scheduler for that dept would call to schedule test.  Pt appreciative for call.

## 2017-05-06 ENCOUNTER — Telehealth (HOSPITAL_COMMUNITY): Payer: Self-pay | Admitting: *Deleted

## 2017-05-06 NOTE — Telephone Encounter (Signed)
Patient given detailed instructions per Myocardial Perfusion Study Information Sheet for the test on  05/11/17. Patient notified to arrive 15 minutes early and that it is imperative to arrive on time for appointment to keep from having the test rescheduled.  If you need to cancel or reschedule your appointment, please call the office within 24 hours of your appointment. . Patient verbalized understanding. Mark Payne

## 2017-05-11 ENCOUNTER — Ambulatory Visit (HOSPITAL_COMMUNITY): Payer: 59 | Attending: Cardiology

## 2017-05-11 DIAGNOSIS — I25119 Atherosclerotic heart disease of native coronary artery with unspecified angina pectoris: Secondary | ICD-10-CM | POA: Insufficient documentation

## 2017-05-11 DIAGNOSIS — R9439 Abnormal result of other cardiovascular function study: Secondary | ICD-10-CM | POA: Diagnosis not present

## 2017-05-11 LAB — MYOCARDIAL PERFUSION IMAGING
CHL CUP NUCLEAR SDS: 1
CHL CUP NUCLEAR SRS: 12
CHL CUP RESTING HR STRESS: 57 {beats}/min
CSEPPHR: 85 {beats}/min
LV dias vol: 141 mL (ref 62–150)
LVSYSVOL: 70 mL
NUC STRESS TID: 1.01
RATE: 0.31
SSS: 13

## 2017-05-11 MED ORDER — TECHNETIUM TC 99M TETROFOSMIN IV KIT
10.7000 | PACK | Freq: Once | INTRAVENOUS | Status: AC | PRN
  Administered 2017-05-11: 10.7 via INTRAVENOUS
  Filled 2017-05-11: qty 11

## 2017-05-11 MED ORDER — REGADENOSON 0.4 MG/5ML IV SOLN
0.4000 mg | Freq: Once | INTRAVENOUS | Status: AC
  Administered 2017-05-11: 0.4 mg via INTRAVENOUS

## 2017-05-11 MED ORDER — TECHNETIUM TC 99M TETROFOSMIN IV KIT
32.9000 | PACK | Freq: Once | INTRAVENOUS | Status: AC | PRN
  Administered 2017-05-11: 32.9 via INTRAVENOUS
  Filled 2017-05-11: qty 33

## 2017-05-13 ENCOUNTER — Telehealth: Payer: Self-pay | Admitting: *Deleted

## 2017-05-13 NOTE — Telephone Encounter (Signed)
Spoke to patient in person

## 2017-05-13 NOTE — Telephone Encounter (Signed)
Pt called in and asked to s/w Richardson Dopp, PAC. I s/w pt who states he was calling to s/w Richardson Dopp, PAC or Dr. Tamala Julian. I advised pt neither provider is in the office today. Pt states he was calling for his stress test results. I advised pt that results are being reviewed by Dr. Tamala Julian. Once read we will call him with his results. Pt asked if Dr. Tamala Julian cold call him tonight. I told pt that I will pass his message on to Dr. Tamala Julian. Pt thanked me for my help today.

## 2017-11-03 ENCOUNTER — Other Ambulatory Visit: Payer: Self-pay | Admitting: Oral and Maxillofacial Surgery

## 2017-11-03 DIAGNOSIS — L820 Inflamed seborrheic keratosis: Secondary | ICD-10-CM | POA: Diagnosis not present

## 2018-01-21 DIAGNOSIS — Z125 Encounter for screening for malignant neoplasm of prostate: Secondary | ICD-10-CM | POA: Diagnosis not present

## 2018-01-21 DIAGNOSIS — E038 Other specified hypothyroidism: Secondary | ICD-10-CM | POA: Diagnosis not present

## 2018-01-21 DIAGNOSIS — Z0000 Encounter for general adult medical examination without abnormal findings: Secondary | ICD-10-CM | POA: Diagnosis not present

## 2018-01-21 DIAGNOSIS — E291 Testicular hypofunction: Secondary | ICD-10-CM | POA: Diagnosis not present

## 2018-01-26 NOTE — Telephone Encounter (Signed)
**Note De-Identified Aleese Kamps Obfuscation** [place] PA approved until 09/06/2038 per [place] letter in the pts chart.  I have sent letter to be scanned into the pts chart.

## 2018-02-02 DIAGNOSIS — G4733 Obstructive sleep apnea (adult) (pediatric): Secondary | ICD-10-CM | POA: Diagnosis not present

## 2018-02-02 DIAGNOSIS — A6000 Herpesviral infection of urogenital system, unspecified: Secondary | ICD-10-CM | POA: Diagnosis not present

## 2018-02-02 DIAGNOSIS — E7849 Other hyperlipidemia: Secondary | ICD-10-CM | POA: Diagnosis not present

## 2018-02-02 DIAGNOSIS — Z0000 Encounter for general adult medical examination without abnormal findings: Secondary | ICD-10-CM | POA: Diagnosis not present

## 2018-02-02 DIAGNOSIS — I25810 Atherosclerosis of coronary artery bypass graft(s) without angina pectoris: Secondary | ICD-10-CM | POA: Diagnosis not present

## 2018-02-02 DIAGNOSIS — E038 Other specified hypothyroidism: Secondary | ICD-10-CM | POA: Diagnosis not present

## 2018-02-02 DIAGNOSIS — Z1389 Encounter for screening for other disorder: Secondary | ICD-10-CM | POA: Diagnosis not present

## 2018-02-08 DIAGNOSIS — Z1212 Encounter for screening for malignant neoplasm of rectum: Secondary | ICD-10-CM | POA: Diagnosis not present

## 2018-03-07 DIAGNOSIS — H04123 Dry eye syndrome of bilateral lacrimal glands: Secondary | ICD-10-CM | POA: Diagnosis not present

## 2018-03-22 ENCOUNTER — Encounter: Payer: Self-pay | Admitting: Pulmonary Disease

## 2018-03-22 ENCOUNTER — Ambulatory Visit (INDEPENDENT_AMBULATORY_CARE_PROVIDER_SITE_OTHER): Payer: BLUE CROSS/BLUE SHIELD | Admitting: Pulmonary Disease

## 2018-03-22 VITALS — BP 118/70 | HR 50 | Ht 69.0 in | Wt 190.0 lb

## 2018-03-22 DIAGNOSIS — G4733 Obstructive sleep apnea (adult) (pediatric): Secondary | ICD-10-CM | POA: Diagnosis not present

## 2018-03-22 NOTE — Progress Notes (Signed)
   Subjective:    Patient ID: Mark Payne, male    DOB: 10/01/1955, 62 y.o.   MRN: [id number]  HPI    Review of Systems  Constitutional: Positive for fatigue. Negative for fever and unexpected weight change.  HENT: Negative for congestion, dental problem, ear pain, nosebleeds, postnasal drip, rhinorrhea, sinus pressure, sneezing, sore throat and trouble swallowing.   Eyes: Negative for redness and itching.  Respiratory: Negative for cough, chest tightness, shortness of breath and wheezing.   Cardiovascular: Negative for palpitations and leg swelling.  Gastrointestinal: Negative for nausea and vomiting.  Genitourinary: Negative for dysuria.  Musculoskeletal: Negative for joint swelling.  Skin: Negative for rash.  Neurological: Negative for headaches.  Hematological: Does not bruise/bleed easily.  Psychiatric/Behavioral: Negative for dysphoric mood. The patient is not nervous/anxious.        Objective:   Physical Exam        Assessment & Plan:

## 2018-03-22 NOTE — Patient Instructions (Signed)
Will arrange for home sleep study Will call to arrange for follow up after sleep study reviewed  

## 2018-03-22 NOTE — Progress Notes (Signed)
[facility]  Chief Complaint  Patient presents with  . sleep consult    evaluate for sleep apnea.  dx'ed with positional sleep apnea X15 years ago per pt. Epworth Score: 10.    Constitutional: BP 118/70 (BP Location: Right Arm, Cuff Size: Normal)   Pulse (!) 50   Ht 5\' 9"  (1.753 m)   Wt 190 lb (86.2 kg)   SpO2 98%   BMI 28.06 kg/m   History of Present Illness: Mark Payne is a 62 y.o. male obstructive sleep apnea.  He had a sleep study about 15 yrs ago.  Found to have positional sleep apnea.  His snoring has gotten worse.  He feels more tired during the day.  His girlfriend says he stops breathing at while asleep.  This happens more on his back.  His mouth gets dry at night.  He doesn't dream much.  He takes a nap in the afternoon when he can at work.  He works as an Magazine features editor.  He goes to sleep at 10 pm.  He falls asleep in minutes.  He wakes up some times to use the bathroom.  He gets out of bed at 545 am.  He feels okay in the morning.  He denies morning headache.  He does not use anything to help him fall sleep or stay awake.  He denies sleep walking, sleep talking, bruxism, or nightmares.  There is no history of restless legs.  He denies sleep hallucinations, sleep paralysis, or cataplexy.  The Epworth score is 10 out of 24.   Comprehensive Respiratory Exam:  Appearance - well kempt  ENMT - nasal mucosa moist, turbinates clear, midline nasal septum, no dental lesions, no gingival bleeding, no oral exudates, decreased AP diameter Neck - no masses, trachea midline, no thyromegaly, no elevation in JVP Respiratory - normal appearance of chest wall, normal respiratory effort w/o accessory muscle use, no dullness on percussion, no wheezing or rales CV - s1s2 regular rate and rhythm, no murmurs, no peripheral edema GI - soft, non tender, no masses Lymph - no adenopathy noted in neck and axillary areas MSK - normal muscle strength  and tone, normal gait Ext - no cyanosis, clubbing, or joint inflammation noted Skin - no rashes, lesions, or ulcers Neuro - oriented to person, place, and time Psych - normal mood and affect  Discussion: He has snoring, sleep disruption, witnessed apnea, and daytime sleepiness.  He has history of CAD and prior history of positional sleep apnea.  His sleep pattern has gotten worse.  Assessment/Plan:  Snoring with excessive daytime sleepiness. - will need to arrange for a home sleep study  Obesity. - discussed how weight can impact sleep and risk for sleep disordered breathing - discussed options to assist with weight loss: combination of diet modification, cardiovascular and strength training exercises  Cardiovascular risk. - had an extensive discussion regarding the adverse health consequences related to untreated sleep disordered breathing - specifically discussed the risks for hypertension, coronary artery disease, cardiac dysrhythmias, cerebrovascular disease, and diabetes - lifestyle modification discussed  Safe driving practices. - discussed how sleep disruption can increase risk of accidents, particularly when driving - safe driving practices were discussed  Therapies for obstructive sleep apnea. - if the sleep study shows significant sleep apnea, then various therapies for treatment were reviewed: CPAP, oral appliance, and surgical interventions    Patient Instructions  Will arrange for home sleep study Will call to arrange for follow up after sleep  study reviewed     Chesley Mires, MD [facility] 03/22/2018, 9:25 AM  Flow Sheet  Sleep tests:  Review of Systems: Constitutional: Positive for fatigue. Negative for fever and unexpected weight change.  HENT: Negative for congestion, dental problem, ear pain, nosebleeds, postnasal drip, rhinorrhea, sinus pressure, sneezing, sore throat and trouble swallowing.   Eyes: Negative for redness and  itching.  Respiratory: Negative for cough, chest tightness, shortness of breath and wheezing.   Cardiovascular: Negative for palpitations and leg swelling.  Gastrointestinal: Negative for nausea and vomiting.  Genitourinary: Negative for dysuria.  Musculoskeletal: Negative for joint swelling.  Skin: Negative for rash.  Neurological: Negative for headaches.  Hematological: Does not bruise/bleed easily.  Psychiatric/Behavioral: Negative for dysphoric mood. The patient is not nervous/anxious.    Past Medical History: He  has a past medical history of Coronary artery disease involving native coronary artery with angina pectoris ([place]) (04/02/2014), Essential hypertension (04/02/2014), History of echocardiogram, Hyperlipidemia (04/02/2014), and LBBB (left bundle branch block) (04/02/2014).  Past Surgical History: He  has a past surgical history that includes left heart catheterization with coronary/graft angiogram (N/A, 11/13/2011); left heart catheterization with coronary/graft angiogram (N/A, 05/18/2014); and left heart catheterization with coronary/graft angiogram (N/A, 11/30/2014).  Family History: His family history includes Breast cancer in his mother; CAD in his father; Healthy in his brother, brother, and brother; Heart disease in his mother.  Social History: He  reports that he has quit smoking. He has never used smokeless tobacco. He reports that he drinks alcohol. He reports that he does not use drugs.  Medications: Allergies as of 03/22/2018      Reactions   Vistaril [hydroxyzine Hcl]    PATIENT HAD NAUSEA IT MADE HIM REALLY SICK   Zocor [simvastatin]    Cranial nerve palsy      Medication List        Accurate as of 03/22/18  9:25 AM. Always use your most recent med list.          aspirin 81 MG tablet Take 81 mg by mouth daily.   clopidogrel 75 MG tablet Commonly known as:  PLAVIX TAKE 1 TABLET BY MOUTH EVERY DAY   ezetimibe 10 MG tablet Commonly known as:  ZETIA Take 1  tablet (10 mg total) by mouth daily.   levothyroxine 150 MCG tablet Commonly known as:  SYNTHROID, LEVOTHROID Take 150 mcg by mouth daily before breakfast.   nebivolol 2.5 MG tablet Commonly known as:  BYSTOLIC Take 2.5 mg by mouth daily.   ranolazine 500 MG 12 hr tablet Commonly known as:  RANEXA Take 500 mg by mouth 2 (two) times daily.   rosuvastatin 10 MG tablet Commonly known as:  CRESTOR Take 1 tablet (10 mg total) by mouth daily.

## 2018-04-10 DIAGNOSIS — G4733 Obstructive sleep apnea (adult) (pediatric): Secondary | ICD-10-CM

## 2018-04-11 ENCOUNTER — Encounter: Payer: Self-pay | Admitting: Pulmonary Disease

## 2018-04-11 ENCOUNTER — Telehealth: Payer: Self-pay | Admitting: Pulmonary Disease

## 2018-04-11 DIAGNOSIS — G4733 Obstructive sleep apnea (adult) (pediatric): Secondary | ICD-10-CM

## 2018-04-11 HISTORY — DX: Obstructive sleep apnea (adult) (pediatric): G47.33

## 2018-04-11 NOTE — Telephone Encounter (Signed)
HST 04/10/18 >> AHI 16.1, SaO2 low 80%   Will have my nurse inform pt that sleep study shows moderate sleep apnea.  Options are 1) CPAP now, 2) ROV first.  If He is agreeable to CPAP, then please send order for auto CPAP range 5 to 15 cm H2O with heated humidity and mask of choice.  Have download sent 1 month after starting CPAP and set up ROV 2 months after starting CPAP.  ROV can be with me or NP.

## 2018-04-11 NOTE — Telephone Encounter (Signed)
Attempted to call patient today regarding results. I did not receive an answer at time of call. I have left a voicemail message for pt to return call. X1  

## 2018-04-13 ENCOUNTER — Other Ambulatory Visit: Payer: Self-pay | Admitting: *Deleted

## 2018-04-13 DIAGNOSIS — G4733 Obstructive sleep apnea (adult) (pediatric): Secondary | ICD-10-CM

## 2018-04-21 NOTE — Telephone Encounter (Signed)
Attempted to call patient today regarding results. I did not receive an answer at time of call. I have left a voicemail message for pt to return call. X2  

## 2018-04-26 NOTE — Addendum Note (Signed)
Addended by: Georjean Mode on: 04/26/2018 03:01 PM   Modules accepted: Orders

## 2018-04-26 NOTE — Telephone Encounter (Signed)
Called and spoke with patient regarding results.  Informed the patient of results and recommendations today. Pt was agreeable to begin cpap therapy, and will need a travel cpap machine also Pt is willing to pay out of pocket for the travel cpap machine and supplies with mask of choice. Place order for auto CPAP range 5 to 15 cm H2O with heated humidity and mask of choice.   Have download sent 1 month after starting CPAP and set up ROV 2 months after starting CPAP.   ROV for 43mo f/u with VS for compliance on new cpap start on 07/06/18 at 9am Pt verbalized understanding and denied any questions or concerns at this time.  Nothing further needed.

## 2018-04-26 NOTE — Telephone Encounter (Signed)
Attempted to call patient today regarding results. I have left a voicemail message for pt to return call. X3 We have left three messages for pt to call our office back regarding results. No response at this time, a letter has been placed in mail today for pt to call our office. Mailed letter of communication today. Nothing further needed at this time.    

## 2018-06-02 DIAGNOSIS — G4733 Obstructive sleep apnea (adult) (pediatric): Secondary | ICD-10-CM | POA: Diagnosis not present

## 2018-07-02 DIAGNOSIS — G4733 Obstructive sleep apnea (adult) (pediatric): Secondary | ICD-10-CM | POA: Diagnosis not present

## 2018-07-06 ENCOUNTER — Ambulatory Visit: Payer: BLUE CROSS/BLUE SHIELD | Admitting: Pulmonary Disease

## 2018-07-11 ENCOUNTER — Other Ambulatory Visit: Payer: Self-pay

## 2018-07-11 DIAGNOSIS — I83893 Varicose veins of bilateral lower extremities with other complications: Secondary | ICD-10-CM

## 2018-07-18 ENCOUNTER — Encounter: Payer: Self-pay | Admitting: Pulmonary Disease

## 2018-07-18 ENCOUNTER — Ambulatory Visit (INDEPENDENT_AMBULATORY_CARE_PROVIDER_SITE_OTHER): Payer: BLUE CROSS/BLUE SHIELD | Admitting: Primary Care

## 2018-07-18 VITALS — BP 118/84 | HR 58

## 2018-07-18 DIAGNOSIS — G4733 Obstructive sleep apnea (adult) (pediatric): Secondary | ICD-10-CM

## 2018-07-18 NOTE — Progress Notes (Signed)
@Patient  ID: Mark Payne, male    DOB: 13-Mar-1956, 62 y.o.   MRN: [id number]  Chief Complaint  Patient presents with  . Follow-up    doing well with CPAP machine    Referring provider: [name], MD  HPI: 62 year old male, former smoker. PMH OSA. Patient of Dr. [name], last seen on 03/22/18. HST 04/10/18 >> AHI 16.1, SaO2 low 80%. New CPAP start, auto titrate 5-15 cm H2O with heated humidity and mask of choice.  07/19/2018 Patient presents today for 2-3 month OSA follow-up. Patient is a new CPAP start, got device approx 2 months ago. DME company is [employer]. Using nasal pillow mask.  He is very surprised at how well he is doing, feels better in the morning and reports waking up easier. He has been wearing CPAP mask almost every night that he is home. Gets around 7.5 hours of sleep a night. Reports that his girlfriend has told him that he has not been snoring as much. Works 1-2 night shifts a week. 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   [employer] download: Used  39/53 days (received cpap 05/27/18) Average use 6 hrs and 79min Auto-titrate 5-15cm H20  Mean pressure 5.9; peak pressure 8.0 AHI 2.9   Allergies  Allergen Reactions  . Vistaril [Hydroxyzine Hcl]     PATIENT HAD NAUSEA IT MADE HIM REALLY SICK  . Zocor [Simvastatin]     Cranial nerve palsy    Immunization History  Administered Date(s) Administered  . Influenza Split 05/27/2018  . Influenza,inj,Quad PF,6+ Mos 05/19/2014    Past Medical History:  Diagnosis Date  . Coronary artery disease involving native coronary artery with angina pectoris ([place]) 04/02/2014   LIMA to LAD and free radial to diagonal #1 20075 // s/p PCI of RCA with DES May 2011 and DES to RCA 05/18/14 // MV 11/13/14: Low risk with significant artifacts but no ischemia.  EF 53 // LHC 3/16: pLAD 95, 80; LCx irregs; pRCA stents ok, mRCA 40-50, L-LAD ok, free Rad-Dx ok, EF 45-50 // MV 4/17: ant-sept, apical  defect, partially reversible inf-lat defect, EF 51 (no change from prior) >> Med Rx.   . Essential hypertension 04/02/2014  . History of echocardiogram    Echo 4/18: normal LV systolic function (EF 56), no RWMA  . Hyperlipidemia 04/02/2014  . LBBB (left bundle branch block) 04/02/2014  . OSA (obstructive sleep apnea) 04/11/2018    Tobacco History: Social History   Tobacco Use  Smoking Status Former Smoker  Smokeless Tobacco Never Used   Counseling given: Not Answered   Outpatient Medications Prior to Visit  Medication Sig Dispense Refill  . aspirin 81 MG tablet Take 81 mg by mouth daily.    . clopidogrel (PLAVIX) 75 MG tablet TAKE 1 TABLET BY MOUTH EVERY DAY 90 tablet 3  . ezetimibe (ZETIA) 10 MG tablet Take 1 tablet (10 mg total) by mouth daily. 30 tablet 11  . levothyroxine (SYNTHROID, LEVOTHROID) 150 MCG tablet Take 150 mcg by mouth daily before breakfast.    . nebivolol (BYSTOLIC) 2.5 MG tablet Take 2.5 mg by mouth daily.    . ranolazine (RANEXA) 500 MG 12 hr tablet Take 500 mg by mouth 2 (two) times daily.    . rosuvastatin (CRESTOR) 10 MG tablet Take 1 tablet (10 mg total) by mouth daily. 30 tablet 11   No facility-administered medications prior to visit.     Review of Systems  Review  of Systems  Constitutional: Negative.   HENT: Negative.   Respiratory: Negative.   Cardiovascular: Negative.   Psychiatric/Behavioral: Negative for sleep disturbance.    Physical Exam  BP 118/84 (BP Location: Left Arm, Cuff Size: Normal)   Pulse (!) 58   SpO2 94%  Physical Exam  Constitutional: He is oriented to person, place, and time. He appears well-developed and well-nourished.  HENT:  Head: Normocephalic and atraumatic.  Eyes: Pupils are equal, round, and reactive to light. EOM are normal.  Neck: Normal range of motion. Neck supple.  Cardiovascular: Normal rate and regular rhythm.  Pulmonary/Chest: Effort normal and breath sounds normal. No respiratory distress. He has no  wheezes.  Neurological: He is alert and oriented to person, place, and time.  Skin: Skin is warm and dry. No rash noted. No erythema.  Psychiatric: He has a normal mood and affect. His behavior is normal. Judgment normal.     Lab Results:  CBC    Component Value Date/Time   WBC 7.9 11/28/2014 1352   RBC 5.09 11/28/2014 1352   HGB 16.6 11/28/2014 1352   HCT 47.3 11/28/2014 1352   PLT 186.0 11/28/2014 1352   MCV 93.0 11/28/2014 1352   MCH 33.7 05/19/2014 0502   MCHC 35.0 11/28/2014 1352   RDW 13.4 11/28/2014 1352   LYMPHSABS 1.8 11/28/2014 1352   MONOABS 1.0 11/28/2014 1352   EOSABS 0.1 11/28/2014 1352   BASOSABS 0.0 11/28/2014 1352    BMET    Component Value Date/Time   NA 137 11/28/2014 1352   K 4.2 11/28/2014 1352   CL 105 11/28/2014 1352   CO2 30 11/28/2014 1352   GLUCOSE 86 11/28/2014 1352   BUN 13 11/28/2014 1352   CREATININE 1.44 11/28/2014 1352   CALCIUM 9.5 11/28/2014 1352   GFRNONAA 82 (L) 05/19/2014 0502   GFRAA >90 05/19/2014 0502    BNP No results found for: BNP  ProBNP No results found for: PROBNP  Imaging: No results found.   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 - Plan Adjust auto titrate pressure setting to 5-10 cm H20  - Advised not to drive if experiencing excessive daytime fatigue/somnolence  - Follow up in 6 months with Dr. Almedia Balls, NP 07/19/2018

## 2018-07-18 NOTE — Patient Instructions (Addendum)
Wear CPAP every night, goal 4-6 hours  If experiencing excessive daytime fatigue or somnolence do not drive. Avoid sedating medication and alcohol prior to bedtime   Follow up in 6 months

## 2018-07-19 ENCOUNTER — Encounter: Payer: Self-pay | Admitting: Primary Care

## 2018-07-19 NOTE — Assessment & Plan Note (Addendum)
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 - Plan Adjust auto titrate pressure setting to 5-10 cm H20  - Advised not to drive if experiencing excessive daytime fatigue/somnolence  - Follow up in 6 months with Dr. Halford Chessman

## 2018-07-20 ENCOUNTER — Encounter: Payer: Self-pay | Admitting: Vascular Surgery

## 2018-07-20 ENCOUNTER — Ambulatory Visit (HOSPITAL_COMMUNITY)
Admission: RE | Admit: 2018-07-20 | Discharge: 2018-07-20 | Disposition: A | Discharge status: Home / Self Care | Payer: BLUE CROSS/BLUE SHIELD | Source: Ambulatory Visit | Attending: Vascular Surgery | Admitting: Vascular Surgery

## 2018-07-20 ENCOUNTER — Ambulatory Visit (INDEPENDENT_AMBULATORY_CARE_PROVIDER_SITE_OTHER): Payer: BLUE CROSS/BLUE SHIELD | Admitting: Vascular Surgery

## 2018-07-20 VITALS — BP 126/83 | HR 52 | Temp 97.6°F | Resp 16 | Ht 69.0 in | Wt 193.0 lb

## 2018-07-20 DIAGNOSIS — I872 Venous insufficiency (chronic) (peripheral): Secondary | ICD-10-CM

## 2018-07-20 DIAGNOSIS — I83811 Varicose veins of right lower extremities with pain: Secondary | ICD-10-CM | POA: Diagnosis not present

## 2018-07-20 DIAGNOSIS — I83893 Varicose veins of bilateral lower extremities with other complications: Secondary | ICD-10-CM | POA: Diagnosis not present

## 2018-07-20 NOTE — Progress Notes (Signed)
REASON FOR CONSULT:    Painful varicose veins of the right lower extremity.  HPI:   Mark Payne is a pleasant 62 y.o. 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  Several months ago he noted significant pain associated with a cluster of varicose veins in his medial right calf.  This pain persisted for several months and he requested vascular evaluation.  He is unaware of any previous history of DVT or phlebitis.  He does describe some aching pain and heaviness in both legs which is associated with standing and relieved somewhat with elevation.  He does wear compression stockings at work.  These are knee-high 15 to 20 mmHg pressure gradient stockings.  Of note, the patient states that since he made the appointment the and symptoms in his right leg have improved significantly.  Interestingly, he has begun swimming quite a bit at the [employer].  I think this could potentially be contributing to his symptom relief.  He does have a history of coronary artery disease.   Past Medical History:  Diagnosis Date  . Coronary artery disease involving native coronary artery with angina pectoris ([place]) 04/02/2014   LIMA to LAD and free radial to diagonal #1 20075 // s/p PCI of RCA with DES May 2011 and DES to RCA 05/18/14 // MV 11/13/14: Low risk with significant artifacts but no ischemia.  EF 53 // LHC 3/16: pLAD 95, 80; LCx irregs; pRCA stents ok, mRCA 40-50, L-LAD ok, free Rad-Dx ok, EF 45-50 // MV 4/17: ant-sept, apical defect, partially reversible inf-lat defect, EF 51 (no change from prior) >> Med Rx.   . Essential hypertension 04/02/2014  . History of echocardiogram    Echo 4/18: normal LV systolic function (EF 56), no RWMA  . Hyperlipidemia 04/02/2014  . LBBB (left bundle branch block) 04/02/2014  . OSA (obstructive sleep apnea) 04/11/2018    Family History  Problem Relation Age of Onset  . Heart disease Mother   . Breast cancer Mother   . CAD  Father   . Healthy Brother   . Healthy Brother   . Healthy Brother     SOCIAL HISTORY: Social History   Socioeconomic History  . Marital status: Married    Spouse name: Not on file  . Number of children: Not on file  . Years of education: Not on file  . Highest education level: Not on file  Occupational History  . Occupation: physician    Comment: Anesthesiologist  Social Needs  . Financial resource strain: Not on file  . Food insecurity:    Worry: Not on file    Inability: Not on file  . Transportation needs:    Medical: Not on file    Non-medical: Not on file  Tobacco Use  . Smoking status: Former Research scientist (life sciences)  . Smokeless tobacco: Never Used  Substance and Sexual Activity  . Alcohol use: Yes  . Drug use: No  . Sexual activity: Yes  Lifestyle  . Physical activity:    Days per week: Not on file    Minutes per session: Not on file  . Stress: Not on file  Relationships  . Social connections:    Talks on phone: Not on file    Gets together: Not on file    Attends religious service: Not on file    Active member of club or organization: Not on file    Attends meetings of clubs or organizations: Not on  file    Relationship status: Not on file  . Intimate partner violence:    Fear of current or ex partner: Not on file    Emotionally abused: Not on file    Physically abused: Not on file    Forced sexual activity: Not on file  Other Topics Concern  . Not on file  Social History Narrative  . Not on file    Allergies  Allergen Reactions  . Vistaril [Hydroxyzine Hcl]     PATIENT HAD NAUSEA IT MADE HIM REALLY SICK  . Zocor [Simvastatin]     Cranial nerve palsy    Current Outpatient Medications  Medication Sig Dispense Refill  . aspirin 81 MG tablet Take 81 mg by mouth daily.    . clopidogrel (PLAVIX) 75 MG tablet TAKE 1 TABLET BY MOUTH EVERY DAY 90 tablet 3  . ezetimibe (ZETIA) 10 MG tablet Take 1 tablet (10 mg total) by mouth daily. 30 tablet 11  . levothyroxine  (SYNTHROID, LEVOTHROID) 150 MCG tablet Take 150 mcg by mouth daily before breakfast.    . nebivolol (BYSTOLIC) 2.5 MG tablet Take 2.5 mg by mouth daily.    . rosuvastatin (CRESTOR) 10 MG tablet Take 1 tablet (10 mg total) by mouth daily. 30 tablet 11  . ranolazine (RANEXA) 500 MG 12 hr tablet Take 500 mg by mouth 2 (two) times daily.     No current facility-administered medications for this visit.     REVIEW OF SYSTEMS:  [X]  denotes positive finding, [ ]  denotes negative finding Cardiac  Comments:  Chest pain or chest pressure:    Shortness of breath upon exertion:    Short of breath when lying flat:    Irregular heart rhythm:        Vascular    Pain in calf, thigh, or hip brought on by ambulation: x   Pain in feet at night that wakes you up from your sleep:     Blood clot in your veins:    Leg swelling:         Pulmonary    Oxygen at home:    Productive cough:     Wheezing:         Neurologic    Sudden weakness in arms or legs:     Sudden numbness in arms or legs:     Sudden onset of difficulty speaking or slurred speech:    Temporary loss of vision in one eye:     Problems with dizziness:         Gastrointestinal    Blood in stool:     Vomited blood:         Genitourinary    Burning when urinating:     Blood in urine:        Psychiatric    Major depression:         Hematologic    Bleeding problems:    Problems with blood clotting too easily:        Skin    Rashes or ulcers:        Constitutional    Fever or chills:     PHYSICAL EXAM:   Vitals:   07/20/18 1541  BP: 126/83  Pulse: (!) 52  Resp: 16  Temp: 97.6 F (36.4 C)  SpO2: 95%  Weight: 193 lb (87.5 kg)  Height: 5\' 9"  (1.753 m)    GENERAL: The patient is a well-nourished male, in no acute distress. The vital signs are documented above. CARDIAC:  There is a regular rate and rhythm.  VASCULAR: I do not detect carotid bruits. He has palpable pedal pulses bilaterally. He has a cluster of  varicose veins in his medial right calf. He has no significant hyperpigmentation. He has no significant leg swelling. PULMONARY: There is good air exchange bilaterally without wheezing or rales. ABDOMEN: Soft and non-tender with normal pitched bowel sounds.  MUSCULOSKELETAL: There are no major deformities or cyanosis. NEUROLOGIC: No focal weakness or paresthesias are detected. SKIN: There are no ulcers or rashes noted. PSYCHIATRIC: The patient has a normal affect.  DATA:    VENOUS DUPLEX: I have independently interpreted his venous duplex scan today. On the right side, there is no evidence of DVT or superficial thrombophlebitis.  There is no deep venous reflux on the right.  There is superficial venous reflux in the right great saphenous vein at the mid thigh, at the knee, and in the proximal calf.  The vein is moderately enlarged to 0.53 cm in the distal thigh.   ASSESSMENT & PLAN:   PAINFUL VARICOSE VEINS OF THE RIGHT LOWER EXTREMITY: Based on his history I suspect that he had some phlebitis in the right calf.  I think this has resolved.  Clearly he has evidence of venous insufficiency with some areas of incompetence in the right great saphenous vein and a cluster varicose veins in the right calf.  He has CEAP C-2 venous disease.  I have discussed with him the importance of intermittent leg elevation the proper positioning for this.  I have encouraged him to continue wearing his compression stockings at work.  We discussed the importance of avoiding prolonged sitting and standing.  We discussed the importance of exercise specifically walking and water aerobics or swimming.  He will call if his symptoms progress.   Deitra Mayo [facility] [phone]

## 2018-08-02 DIAGNOSIS — G4733 Obstructive sleep apnea (adult) (pediatric): Secondary | ICD-10-CM | POA: Diagnosis not present

## 2018-08-08 ENCOUNTER — Telehealth: Payer: Self-pay | Admitting: Pulmonary Disease

## 2018-08-08 NOTE — Telephone Encounter (Signed)
lmtcb for pt.  

## 2018-08-09 NOTE — Telephone Encounter (Signed)
Can you pull download on patient CPAP for me please

## 2018-08-09 NOTE — Telephone Encounter (Signed)
Spoke with Iona Beard at [facility]. He is going to check on this for [place] and call us back. Will await his call.

## 2018-08-09 NOTE — Telephone Encounter (Signed)
So the pt's pressure was changed back in November. Pt states that he still feels like he's "being blown away." He would like Beth's recommendations.  Beth - please advise. Thanks.

## 2018-08-09 NOTE — Telephone Encounter (Signed)
Spoke with pt. States at his last appointment, Eustaquio Maize was going to adjust his CPAP pressure. Pt wants to make sure this was taken care of because he can't tell a difference in the pressure. We did send the order to [facility] to have this done but we need to verify this with them. Message will be routed to Triage to contact [facility] as they are not open right now.

## 2018-08-09 NOTE — Telephone Encounter (Signed)
Olivia Mackie with [employer] returned call [phone]).  States CPAP pressure setting is 5-10.

## 2018-08-09 NOTE — Telephone Encounter (Signed)
Spoke with patient, feels no change with pressure of cpap. He is aware that we did adjust pressure setting to 5-10cm H20. AHI is 2.8. Average pressure 7.5. Patient using nasal mask. Nothing further needed at this time. Pleased with results.

## 2018-08-15 NOTE — Telephone Encounter (Signed)
Thank you :)

## 2018-10-10 DIAGNOSIS — G43011 Migraine without aura, intractable, with status migrainosus: Secondary | ICD-10-CM | POA: Diagnosis not present

## 2018-12-29 DIAGNOSIS — G4733 Obstructive sleep apnea (adult) (pediatric): Secondary | ICD-10-CM | POA: Diagnosis not present

## 2018-12-29 DIAGNOSIS — E785 Hyperlipidemia, unspecified: Secondary | ICD-10-CM | POA: Diagnosis not present

## 2018-12-29 DIAGNOSIS — E039 Hypothyroidism, unspecified: Secondary | ICD-10-CM | POA: Diagnosis not present

## 2018-12-29 DIAGNOSIS — I25810 Atherosclerosis of coronary artery bypass graft(s) without angina pectoris: Secondary | ICD-10-CM | POA: Diagnosis not present

## 2019-01-11 ENCOUNTER — Ambulatory Visit: Payer: BLUE CROSS/BLUE SHIELD | Admitting: Pulmonary Disease

## 2019-05-17 DIAGNOSIS — E7849 Other hyperlipidemia: Secondary | ICD-10-CM | POA: Diagnosis not present

## 2019-05-17 DIAGNOSIS — E038 Other specified hypothyroidism: Secondary | ICD-10-CM | POA: Diagnosis not present

## 2019-06-14 ENCOUNTER — Other Ambulatory Visit: Payer: Self-pay

## 2019-06-14 DIAGNOSIS — Z20822 Contact with and (suspected) exposure to covid-19: Secondary | ICD-10-CM

## 2019-06-14 DIAGNOSIS — Z20828 Contact with and (suspected) exposure to other viral communicable diseases: Secondary | ICD-10-CM | POA: Diagnosis not present

## 2019-06-16 LAB — NOVEL CORONAVIRUS, NAA: SARS-CoV-2, NAA: NOT DETECTED

## 2019-06-28 DIAGNOSIS — R748 Abnormal levels of other serum enzymes: Secondary | ICD-10-CM | POA: Diagnosis not present

## 2019-06-28 DIAGNOSIS — G4733 Obstructive sleep apnea (adult) (pediatric): Secondary | ICD-10-CM | POA: Diagnosis not present

## 2019-06-28 DIAGNOSIS — K625 Hemorrhage of anus and rectum: Secondary | ICD-10-CM | POA: Diagnosis not present

## 2019-06-28 DIAGNOSIS — I25810 Atherosclerosis of coronary artery bypass graft(s) without angina pectoris: Secondary | ICD-10-CM | POA: Diagnosis not present

## 2019-06-28 DIAGNOSIS — E785 Hyperlipidemia, unspecified: Secondary | ICD-10-CM | POA: Diagnosis not present

## 2019-06-28 DIAGNOSIS — E039 Hypothyroidism, unspecified: Secondary | ICD-10-CM | POA: Diagnosis not present

## 2019-07-02 DIAGNOSIS — Z20828 Contact with and (suspected) exposure to other viral communicable diseases: Secondary | ICD-10-CM | POA: Diagnosis not present

## 2019-07-05 DIAGNOSIS — Z20828 Contact with and (suspected) exposure to other viral communicable diseases: Secondary | ICD-10-CM | POA: Diagnosis not present

## 2019-07-05 DIAGNOSIS — K625 Hemorrhage of anus and rectum: Secondary | ICD-10-CM | POA: Diagnosis not present

## 2019-09-09 DIAGNOSIS — Z20828 Contact with and (suspected) exposure to other viral communicable diseases: Secondary | ICD-10-CM | POA: Diagnosis not present

## 2019-09-09 DIAGNOSIS — R05 Cough: Secondary | ICD-10-CM | POA: Diagnosis not present

## 2019-10-28 DIAGNOSIS — Z20828 Contact with and (suspected) exposure to other viral communicable diseases: Secondary | ICD-10-CM | POA: Diagnosis not present

## 2019-11-14 DIAGNOSIS — N390 Urinary tract infection, site not specified: Secondary | ICD-10-CM | POA: Diagnosis not present

## 2019-11-14 DIAGNOSIS — R319 Hematuria, unspecified: Secondary | ICD-10-CM | POA: Diagnosis not present

## 2019-11-15 DIAGNOSIS — K625 Hemorrhage of anus and rectum: Secondary | ICD-10-CM | POA: Diagnosis not present

## 2019-11-22 ENCOUNTER — Telehealth: Payer: Self-pay | Admitting: Oncology

## 2019-11-22 NOTE — Telephone Encounter (Signed)
Received a new hem referral from Dr. Dagmar Hait from [facility] for polycythemia/elevated liver enzymes. [name] has been cld and scheduled to see [name] on 4/1 at 11am. He's been made aware to arrive 15 minutes early.

## 2019-12-07 ENCOUNTER — Telehealth: Payer: Self-pay | Admitting: Oncology

## 2019-12-07 ENCOUNTER — Inpatient Hospital Stay: Payer: BC Managed Care – PPO | Attending: Oncology | Admitting: Oncology

## 2019-12-07 ENCOUNTER — Inpatient Hospital Stay: Payer: BC Managed Care – PPO

## 2019-12-07 ENCOUNTER — Other Ambulatory Visit: Payer: Self-pay

## 2019-12-07 VITALS — BP 122/83 | HR 56 | Temp 98.2°F | Resp 20 | Ht 69.0 in | Wt 202.2 lb

## 2019-12-07 DIAGNOSIS — I2510 Atherosclerotic heart disease of native coronary artery without angina pectoris: Secondary | ICD-10-CM | POA: Insufficient documentation

## 2019-12-07 DIAGNOSIS — Z803 Family history of malignant neoplasm of breast: Secondary | ICD-10-CM | POA: Insufficient documentation

## 2019-12-07 DIAGNOSIS — Z8249 Family history of ischemic heart disease and other diseases of the circulatory system: Secondary | ICD-10-CM | POA: Insufficient documentation

## 2019-12-07 DIAGNOSIS — E785 Hyperlipidemia, unspecified: Secondary | ICD-10-CM | POA: Insufficient documentation

## 2019-12-07 DIAGNOSIS — Z79899 Other long term (current) drug therapy: Secondary | ICD-10-CM | POA: Insufficient documentation

## 2019-12-07 DIAGNOSIS — D45 Polycythemia vera: Secondary | ICD-10-CM

## 2019-12-07 DIAGNOSIS — Z87891 Personal history of nicotine dependence: Secondary | ICD-10-CM | POA: Diagnosis not present

## 2019-12-07 DIAGNOSIS — G4733 Obstructive sleep apnea (adult) (pediatric): Secondary | ICD-10-CM | POA: Diagnosis not present

## 2019-12-07 DIAGNOSIS — Z7902 Long term (current) use of antithrombotics/antiplatelets: Secondary | ICD-10-CM | POA: Insufficient documentation

## 2019-12-07 DIAGNOSIS — R7401 Elevation of levels of liver transaminase levels: Secondary | ICD-10-CM | POA: Insufficient documentation

## 2019-12-07 DIAGNOSIS — Z7982 Long term (current) use of aspirin: Secondary | ICD-10-CM | POA: Insufficient documentation

## 2019-12-07 DIAGNOSIS — D751 Secondary polycythemia: Secondary | ICD-10-CM | POA: Diagnosis not present

## 2019-12-07 DIAGNOSIS — I10 Essential (primary) hypertension: Secondary | ICD-10-CM | POA: Diagnosis not present

## 2019-12-07 LAB — CBC WITH DIFFERENTIAL (CANCER CENTER ONLY)
Abs Immature Granulocytes: 0.01 10*3/uL (ref 0.00–0.07)
Basophils Absolute: 0.1 10*3/uL (ref 0.0–0.1)
Basophils Relative: 1 %
Eosinophils Absolute: 0.1 10*3/uL (ref 0.0–0.5)
Eosinophils Relative: 2 %
HCT: 49.2 % (ref 39.0–52.0)
Hemoglobin: 16.8 g/dL (ref 13.0–17.0)
Immature Granulocytes: 0 %
Lymphocytes Relative: 18 %
Lymphs Abs: 1.4 10*3/uL (ref 0.7–4.0)
MCH: 32.9 pg (ref 26.0–34.0)
MCHC: 34.1 g/dL (ref 30.0–36.0)
MCV: 96.5 fL (ref 80.0–100.0)
Monocytes Absolute: 0.7 10*3/uL (ref 0.1–1.0)
Monocytes Relative: 9 %
Neutro Abs: 5.6 10*3/uL (ref 1.7–7.7)
Neutrophils Relative %: 70 %
Platelet Count: 198 10*3/uL (ref 150–400)
RBC: 5.1 MIL/uL (ref 4.22–5.81)
RDW: 12.9 % (ref 11.5–15.5)
WBC Count: 7.8 10*3/uL (ref 4.0–10.5)
nRBC: 0 % (ref 0.0–0.2)

## 2019-12-07 NOTE — Progress Notes (Signed)
Reason for the request:   Polycythemia  HPI: I was asked by Dr. Dagmar Hait to evaluate Dr. Ambrose Pancoast for the evaluation of polycythemia.  He is a 64 year old man with history of hypertension, coronary disease as well as sleep apnea.  He reports history of elevated hemoglobin for many years although no formal diagnosis given.  He has donated blood on multiple occasions which have helped reduce his hemoglobin and make him feel better.  He was noted to have elevated hemoglobin of 18.1 with hematocrit of 52 on laboratory testing obtained on June 28, 2019.  His white cell count and platelet count were normal at that time.  CBC obtained on November 15, 2019 showed a hemoglobin of 14.9 and platelet count of 175.  His MCV was elevated at 98.1.  This was very close to the time of his phlebotomy.  He was also noted to have an elevated AST of 50 and ALT of 62 with normal bilirubin, total protein and electrolytes.  His creatinine was 1.1.  He does report some occasional headaches and fatigue associated with elevated hemoglobin.  He does not report any headaches, blurry vision, syncope or seizures. Does not report any fevers, chills or sweats.  Does not report any cough, wheezing or hemoptysis.  Does not report any chest pain, palpitation, orthopnea or leg edema.  Does not report any nausea, vomiting or abdominal pain.  Does not report any constipation or diarrhea.  Does not report any skeletal complaints.    Does not report frequency, urgency or hematuria.  Does not report any skin rashes or lesions. Does not report any heat or cold intolerance.  Does not report any lymphadenopathy or petechiae.  Does not report any anxiety or depression.  Remaining review of systems is negative.    Past Medical History:  Diagnosis Date  . Coronary artery disease involving native coronary artery with angina pectoris ([place]) 04/02/2014   LIMA to LAD and free radial to diagonal #1 20075 // s/p PCI of RCA with DES May 2011 and DES to RCA 05/18/14  // MV 11/13/14: Low risk with significant artifacts but no ischemia.  EF 53 // LHC 3/16: pLAD 95, 80; LCx irregs; pRCA stents ok, mRCA 40-50, L-LAD ok, free Rad-Dx ok, EF 45-50 // MV 4/17: ant-sept, apical defect, partially reversible inf-lat defect, EF 51 (no change from prior) >> Med Rx.   . Essential hypertension 04/02/2014  . History of echocardiogram    Echo 4/18: normal LV systolic function (EF 56), no RWMA  . Hyperlipidemia 04/02/2014  . LBBB (left bundle branch block) 04/02/2014  . OSA (obstructive sleep apnea) 04/11/2018  :  Past Surgical History:  Procedure Laterality Date  . LEFT HEART CATHETERIZATION WITH CORONARY/GRAFT ANGIOGRAM N/A 11/13/2011   Procedure: LEFT HEART CATHETERIZATION WITH Beatrix Fetters;  Surgeon: Sinclair Grooms, MD;  Location: [facility] CATH LAB;  Service: Cardiovascular;  Laterality: N/A;  . LEFT HEART CATHETERIZATION WITH CORONARY/GRAFT ANGIOGRAM N/A 05/18/2014   Procedure: LEFT HEART CATHETERIZATION WITH Beatrix Fetters;  Surgeon: Sinclair Grooms, MD;  Location: [facility] CATH LAB;  Service: Cardiovascular;  Laterality: N/A;  . LEFT HEART CATHETERIZATION WITH CORONARY/GRAFT ANGIOGRAM N/A 11/30/2014   Procedure: LEFT HEART CATHETERIZATION WITH Beatrix Fetters;  Surgeon: Belva Crome, MD;  Location: [facility] CATH LAB;  Service: Cardiovascular;  Laterality: N/A;  :   Current Outpatient Medications:  .  aspirin 81 MG tablet, Take 81 mg by mouth daily., Disp: , Rfl:  .  clopidogrel (PLAVIX) 75 MG tablet, TAKE  1 TABLET BY MOUTH EVERY DAY, Disp: 90 tablet, Rfl: 3 .  ezetimibe (ZETIA) 10 MG tablet, Take 1 tablet (10 mg total) by mouth daily., Disp: 30 tablet, Rfl: 11 .  levothyroxine (SYNTHROID, LEVOTHROID) 150 MCG tablet, Take 150 mcg by mouth daily before breakfast., Disp: , Rfl:  .  nebivolol (BYSTOLIC) 2.5 MG tablet, Take 2.5 mg by mouth daily., Disp: , Rfl:  .  ranolazine (RANEXA) 500 MG 12 hr tablet, Take 500 mg by mouth 2 (two) times daily., Disp: , Rfl:   .  rosuvastatin (CRESTOR) 10 MG tablet, Take 1 tablet (10 mg total) by mouth daily., Disp: 30 tablet, Rfl: 11:  Allergies  Allergen Reactions  . Vistaril [Hydroxyzine Hcl]     PATIENT HAD NAUSEA IT MADE HIM REALLY SICK  . Zocor [Simvastatin]     Cranial nerve palsy  :  Family History  Problem Relation Age of Onset  . Heart disease Mother   . Breast cancer Mother   . CAD Father   . Healthy Brother   . Healthy Brother   . Healthy Brother   :  Social History   Socioeconomic History  . Marital status: Married    Spouse name: Not on file  . Number of children: Not on file  . Years of education: Not on file  . Highest education level: Not on file  Occupational History  . Occupation: physician    Comment: Anesthesiologist  Tobacco Use  . Smoking status: Former Research scientist (life sciences)  . Smokeless tobacco: Never Used  Substance and Sexual Activity  . Alcohol use: Yes  . Drug use: No  . Sexual activity: Yes  Other Topics Concern  . Not on file  Social History Narrative  . Not on file   Social Determinants of Health   Financial Resource Strain:   . Difficulty of Paying Living Expenses:   Food Insecurity:   . Worried About Charity fundraiser in the Last Year:   . Arboriculturist in the Last Year:   Transportation Needs:   . Film/video editor (Medical):   Marland Kitchen Lack of Transportation (Non-Medical):   Physical Activity:   . Days of Exercise per Week:   . Minutes of Exercise per Session:   Stress:   . Feeling of Stress :   Social Connections:   . Frequency of Communication with Friends and Family:   . Frequency of Social Gatherings with Friends and Family:   . Attends Religious Services:   . Active Member of Clubs or Organizations:   . Attends Archivist Meetings:   Marland Kitchen Marital Status:   Intimate Partner Violence:   . Fear of Current or Ex-Partner:   . Emotionally Abused:   Marland Kitchen Physically Abused:   . Sexually Abused:   :  Pertinent items are noted in  HPI.  Exam: Blood pressure 122/83, pulse (!) 56, temperature 98.2 F (36.8 C), resp. rate 20, height 5\' 9"  (1.753 m), weight 202 lb 3.2 oz (91.7 kg), SpO2 97 %.  ECOG 0  General appearance: alert and cooperative appeared without distress. Head: atraumatic without any abnormalities. Eyes: conjunctivae/corneas clear. PERRL.  Sclera anicteric. Throat: lips, mucosa, and tongue normal; without oral thrush or ulcers. GI: soft, non-tender; bowel sounds normal; no masses,  no organomegaly Skin: Skin color, texture, turgor normal. No rashes or lesions Lymph nodes: Cervical, supraclavicular, and axillary nodes normal. Neurologic: Grossly normal without any motor, sensory or deep tendon reflexes. Musculoskeletal: No joint deformity or effusion.  Assessment and Plan:   64 year old with:  1.  Polycythemia detected on laboratory testing in October 2020 with improvement in his hemoglobin from 18.to 14.9 in March 2021.  The differential diagnosis was reviewed at this time.  Secondary causes of polycythemia would include dehydration, sleep apnea as well as few exposure.  Polycythemia vera would be a possibility given the chronicity of these findings.  To fully evaluate his presentation, I will obtain JAK2 mutation as well as a myeloproliferative disorder panel as well as erythropoietin level.  And from a management options, I recommended continued blood donation and possibly therapeutic phlebotomy to keep his hematocrit below 50.  2.  Thrombosis prophylaxis: Currently on aspirin and Plavix because of his cardiovascular disease.  His risk of thrombosis is very low at this time.   3.  Elevated AST and ALT: Unclear etiology at this time.  I do not see correlation at this time related to his polycythemia.  4.  Follow-up: Will be in 6 months for repeat evaluation.  45  minutes were dedicated to this visit. The time was spent on reviewing laboratory data, discussing treatment options, discussing  differential diagnosis and answering questions regarding future plan.    A copy of this consult has been forwarded to the requesting physician.

## 2019-12-07 NOTE — Telephone Encounter (Signed)
Made no changes to schedule. Checked pt in for labs. Pt declined print out of AVS and stated he would refer to mychart.

## 2019-12-08 LAB — ERYTHROPOIETIN: Erythropoietin: 7.2 m[IU]/mL (ref 2.6–18.5)

## 2019-12-11 ENCOUNTER — Telehealth: Payer: Self-pay | Admitting: Oncology

## 2019-12-11 NOTE — Telephone Encounter (Signed)
Scheduled per los. Called and left msg. Mailed printout  °

## 2019-12-20 LAB — JAK2 (INCLUDING V617F AND EXON 12), MPL,& CALR-NEXT GEN SEQ

## 2019-12-21 DIAGNOSIS — R310 Gross hematuria: Secondary | ICD-10-CM | POA: Diagnosis not present

## 2019-12-21 DIAGNOSIS — Z125 Encounter for screening for malignant neoplasm of prostate: Secondary | ICD-10-CM | POA: Diagnosis not present

## 2019-12-21 DIAGNOSIS — R8271 Bacteriuria: Secondary | ICD-10-CM | POA: Diagnosis not present

## 2020-01-01 DIAGNOSIS — R310 Gross hematuria: Secondary | ICD-10-CM | POA: Diagnosis not present

## 2020-01-03 ENCOUNTER — Telehealth: Payer: Self-pay | Admitting: Interventional Cardiology

## 2020-01-03 ENCOUNTER — Other Ambulatory Visit: Payer: Self-pay | Admitting: Urology

## 2020-01-03 ENCOUNTER — Telehealth: Payer: Self-pay | Admitting: General Practice

## 2020-01-03 ENCOUNTER — Encounter (HOSPITAL_BASED_OUTPATIENT_CLINIC_OR_DEPARTMENT_OTHER): Payer: Self-pay | Admitting: Anesthesiology

## 2020-01-03 DIAGNOSIS — N200 Calculus of kidney: Secondary | ICD-10-CM

## 2020-01-03 NOTE — Telephone Encounter (Signed)
I have s/w pt in regards to he will need an appt for pre op clearance. Pt was last seen 12/22/16 with Richardson Dopp, PAC, this will now make the pt a New Pt per [employer]. Pt states he needs a semi-urgent Lithotripsy so he does not end up with a blockage or he may end up with an emergency Lithotripsy. Pt asked could Dr. Tamala Julian just clear him to hold the Plavix x 5 days and then he can see Dr. Tamala Julian after he has his procedure. I advised the pt that I will have the pre op provider call him to discuss further. Pt thanked me for the help and the call.

## 2020-01-03 NOTE — Telephone Encounter (Signed)
   Primary Cardiologist:Henry Nicholes Stairs III, MD  Chart reviewed as part of pre-operative protocol coverage. Because of Heriberto Antigua, MD's past medical history and time since last visit, he/she will require a follow-up visit in order to better assess preoperative cardiovascular risk.  Pt has not been seen since 2018. Surgery scheduled for 01/15/20.  Pre-op covering staff: - Please schedule appointment and call patient to inform them. - Please contact requesting surgeon's office via preferred method (i.e, phone, fax) to inform them of need for appointment prior to surgery.  If applicable, this message will also be routed to pharmacy pool and/or primary cardiologist for input on holding anticoagulant/antiplatelet agent as requested below so that this information is available at time of patient's appointment.   Tami Lin Hendrix Console, PA  01/03/2020, 11:54 AM

## 2020-01-03 NOTE — Telephone Encounter (Signed)
Dr Tamala Julian this patient needs a semi-urgent lithotripsy and needs clearance to hold Plavix.  He had CABG in '05 and PCI in 2011 and 2015. He had a low risk Nuc in 2017.  Cardiology hasn't seen him since 2018.  He apparently cannot get an appointment before his procedure is scheduled and wants your OK to hold the Plavix -have his procedure-then f/u with cardiology.  Kerin Ransom PA-C 01/03/2020 1:38 PM

## 2020-01-03 NOTE — Telephone Encounter (Signed)
   [facility] Pre-operative Risk Assessment    Request for surgical clearance:  1. What type of surgery is being performed? Lithotripsey for Kidney Stone - pt said he tried to get an appt before his surgery, there was no availabilty until after his surgery  2. When is this surgery scheduled? 01-15-20   3. What type of clearance is required (medical clearance vs. Pharmacy clearance to hold med vs. Both)?Medical  4. Are there any medications that need to be held prior to surgery and how long? no   5. Practice name and name of physician performing surgery? Dr Link Snuffer   6. What is your office phone numbe [phone]    7.   What is your office fax [phone]  8.   Anesthesia type (None, local, MAC, general) ?  MAC  Glyn Ade 01/03/2020, 9:58 AM  _________________________________________________________________   (provider comments below)

## 2020-01-03 NOTE — Telephone Encounter (Signed)
   Went to chart to check when pt last seen

## 2020-01-03 NOTE — Telephone Encounter (Signed)
   Pt would like to get a call from Dr. Tamala Julian

## 2020-01-03 NOTE — Telephone Encounter (Signed)
Spoke with pt and made him aware that Kerin Ransom, PA-C sent message over to Dr. Tamala Julian about clearing him for the procedure.  Advised Dr. Tamala Julian not in the office today and we would get everything sent over to [facility] once we hear back from Dr. Tamala Julian. Pt appreciative for call.

## 2020-01-03 NOTE — Progress Notes (Signed)
Patient scheduled for covid test 12/12/2019 @ 1300.

## 2020-01-05 ENCOUNTER — Ambulatory Visit (INDEPENDENT_AMBULATORY_CARE_PROVIDER_SITE_OTHER): Payer: BC Managed Care – PPO | Admitting: Cardiology

## 2020-01-05 ENCOUNTER — Encounter: Payer: Self-pay | Admitting: Cardiology

## 2020-01-05 ENCOUNTER — Other Ambulatory Visit: Payer: Self-pay

## 2020-01-05 VITALS — BP 110/70 | HR 49 | Ht 69.0 in | Wt 192.0 lb

## 2020-01-05 DIAGNOSIS — I2510 Atherosclerotic heart disease of native coronary artery without angina pectoris: Secondary | ICD-10-CM | POA: Diagnosis not present

## 2020-01-05 DIAGNOSIS — I447 Left bundle-branch block, unspecified: Secondary | ICD-10-CM | POA: Diagnosis not present

## 2020-01-05 DIAGNOSIS — Z01810 Encounter for preprocedural cardiovascular examination: Secondary | ICD-10-CM | POA: Diagnosis not present

## 2020-01-05 DIAGNOSIS — Z951 Presence of aortocoronary bypass graft: Secondary | ICD-10-CM

## 2020-01-05 NOTE — Telephone Encounter (Signed)
Pt was scheduled an appt today with Vin Bhagat, PAC for pre op clearance. I will forward clearance notes to surgeon Dr. Genevie Cheshire as Juluis Rainier. I will remove from the pre op call back pool.

## 2020-01-05 NOTE — Progress Notes (Signed)
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   He has been battling with nephrolithiasis.  In need of repeated lithotripsy.  He will need to hold his Plavix prior.  He underwent CABG in 2005 and subsequent PCI to the RCA x 2 with a DES (2011 and 2015). LHC in 3/16 demonstrated patent bypass grafts and patent RCA stents. Nuclear stress test in 4/17 demonstrated anteroseptal, apical, inferior lateral defect consistent with LBBB artifact versus prior anteroseptal/apical infarct and prior inferior lateral infarct with mild peri-infarct ischemia, EF 51. It was felt that his study demonstrated similar findings to his prior evaluations. Medical therapy was continued.   He was doing well on cardiac stand point when last seen by APP 12/2016 for surgical clearance for hernia repair.   Still continues to do quite well.  Works as an Magazine features editor.  No fevers chills nausea vomiting syncope bleeding.  No chest discomfort.   Past Medical History:  Diagnosis Date  . Coronary artery disease involving native coronary artery with angina pectoris ([place]) 04/02/2014   LIMA to LAD and free radial to diagonal #1 [id number] // s/p PCI of RCA with DES May 2011 and DES to RCA 05/18/14 // MV 11/13/14: Low risk with significant artifacts but no ischemia.  EF 53 // LHC 3/16: pLAD 95, 80; LCx irregs; pRCA stents ok, mRCA 40-50, L-LAD ok, free Rad-Dx ok, EF 45-50 // MV 4/17: ant-sept, apical defect, partially reversible inf-lat defect, EF 51 (no change from prior) >> Med Rx.   . Essential hypertension 04/02/2014  . History of echocardiogram    Echo 4/18: normal LV systolic function (EF 56), no  RWMA  . Hyperlipidemia 04/02/2014  . LBBB (left bundle branch block) 04/02/2014  . OSA (obstructive sleep apnea) 04/11/2018    Past Surgical History:  Procedure Laterality Date  . LEFT HEART CATHETERIZATION WITH CORONARY/GRAFT ANGIOGRAM N/A 11/13/2011   Procedure: LEFT HEART CATHETERIZATION WITH Beatrix Fetters;  Surgeon: Sinclair Grooms, MD;  Location[facility] C[facility] LAB;  Service: Cardiovascular;  Laterality: N/A;  . LEFT HEART CATHETERIZATION WITH CORONARY/GRAFT ANGIOGRAM N/A 05/18/2014   Procedure: LEFT HEART CATHETERIZATION WITH Beatrix Fetters;  Surgeon: Sinclair Grooms, MD;  Location[facility] C[facility] LAB;  Service: Cardiovascular;  Laterality: N/A;  . LEFT HEART CATHETERIZATION WITH CORONARY/GRAFT ANGIOGRAM N/A 11/30/2014   Procedure: LEFT HEART CATHETERIZATION WITH Beatrix Fetters;  Surgeon: Belva Crome, MD;  Location: [facility] CATH LAB;  Service: Cardiovascular;  Laterality: N/A;    Current Medications: Current Meds  Medication Sig  . aspirin 81 MG tablet Take 81 mg by mouth daily.  . clopidogrel (PLAVIX) 75 MG tablet TAKE 1 TABLET BY MOUTH EVERY DAY  . ezetimibe (ZETIA) 10 MG tablet Take 1 tablet (10 mg total) by mouth daily.  Marland Kitchen levothyroxine (SYNTHROID, LEVOTHROID) 150 MCG tablet Take 150 mcg by mouth daily before breakfast.  . nebivolol (BYSTOLIC) 2.5 MG tablet Take 2.5 mg by mouth daily.  . rosuvastatin (CRESTOR) 10 MG tablet Take 1 tablet (10 mg total) by mouth daily.     Allergies:   Vistaril [hydroxyzine hcl] and Zocor [  simvastatin]   Social History   Socioeconomic History  . Marital status: Married    Spouse name: Not on file  . Number of children: Not on file  . Years of education: Not on file  . Highest education level: Not on file  Occupational History  . Occupation: physician    Comment: Anesthesiologist  Tobacco Use  . Smoking status: Former Research scientist (life sciences)  . Smokeless tobacco: Never Used  Substance and Sexual Activity  . Alcohol use: Yes  . Drug use: No   . Sexual activity: Yes  Other Topics Concern  . Not on file  Social History Narrative  . Not on file   Social Determinants of Health   Financial Resource Strain:   . Difficulty of Paying Living Expenses:   Food Insecurity:   . Worried About Charity fundraiser in the Last Year:   . Arboriculturist in the Last Year:   Transportation Needs:   . Film/video editor (Medical):   Marland Kitchen Lack of Transportation (Non-Medical):   Physical Activity:   . Days of Exercise per Week:   . Minutes of Exercise per Session:   Stress:   . Feeling of Stress :   Social Connections:   . Frequency of Communication with Friends and Family:   . Frequency of Social Gatherings with Friends and Family:   . Attends Religious Services:   . Active Member of Clubs or Organizations:   . Attends Archivist Meetings:   Marland Kitchen Marital Status:      Family History: The patient's family history includes Breast cancer in his mother; CAD in his father; Healthy in his brother, brother, and brother; Heart disease in his mother.  ROS:   Please see the history of present illness.    Denies any fevers chills nausea vomiting syncope bleedingall other systems reviewed and are negative.  EKGs/Labs/Other Studies Reviewed:    The following studies were reviewed today:  Echo 12/14/16 Normal LV function, normal wall motion  Myoview 12/10/15 Low risk stress nuclear study with large, severe, fixed anteroseptal/apical defect and large, severe, partially reversible inferior lateral defect; findings consistent with probable LBBB artifact vs prior anteroseptal/apical infarct and prior inferior lateral infarct with mild peri-infarct ischemia; EF 51 with mild global hypokinesis and mild LVE.  LHC 11/30/14 LM patent LAD prox 95% before stent, 80% after stent LCx irregularities (nothing > 50%) RCA prox to mid stent ok, mid 40-50% L-LAD patent Free Radial-Dx patent EF 45-50%  Myoview 11/13/14 Low risk stress nuclear study  with significant artifacts but no ischemia.LV Ejection Fraction: 53%.LV Wall Motion:Paradoxical septal motion.   EKG:  EKG is  ordered today.  The ekg ordered today demonstrates sinus bradycardia left bundle branch block  Recent Labs: 12/07/2019: Hemoglobin 16.8; Platelet Count 198  Recent Lipid Panel    Component Value Date/Time   CHOL 116 12/22/2016 0000   TRIG 117 12/22/2016 0000   HDL 46 12/22/2016 0000   CHOLHDL 2.5 12/22/2016 0000   CHOLHDL 2.4 04/03/2016 1230   VLDL 22 04/03/2016 1230   LDLCALC 47 12/22/2016 0000   LDLDIRECT 58 12/22/2016 0000    Physical Exam:    VS:  BP 110/70   Pulse (!) 49   Ht 5\' 9"  (1.753 m)   Wt 192 lb (87.1 kg)   SpO2 95%   BMI 28.35 kg/m     Wt Readings from Last 3 Encounters:  01/05/20 192 lb (87.1 kg)  12/07/19 202 lb 3.2 oz (91.7 kg)  07/20/18 193 lb (87.5 kg)     GEN:  Well nourished, well developed in no acute distress HEENT: Normal NECK: No JVD; No carotid bruits LYMPHATICS: No lymphadenopathy CARDIAC: RRR, no murmurs, rubs, gallops RESPIRATORY:  Clear to auscultation without rales, wheezing or rhonchi  ABDOMEN: Soft, non-tender, non-distended MUSCULOSKELETAL:  No edema; No deformity  SKIN: Warm and dry NEUROLOGIC:  Alert and oriented x 3 PSYCHIATRIC:  Normal affect   ASSESSMENT:    1. Pre-operative cardiovascular examination   2. Coronary artery disease involving native coronary artery of native heart without angina pectoris   3. S/P CABG (coronary artery bypass graft)   4. LBBB (left bundle branch block)    PLAN:    In order of problems listed above:  Preop cardiovascular exam -Overall has been doing quite well without any anginal symptoms.  No adverse arrhythmias.  No evidence of heart failure.  He is able to complete easily greater than 4 METS of activity.  No further cardiac work-up necessary at this time. -It is fine to hold the Plavix for 5 to 7 days prior to surgery and resume afterwards as bleeding risks  in the setting of lithotripsy will be too high with concomitant Plavix.  Try to remain on the aspirin throughout the perioperative period if possible.  CAD post CABG -CABG in 2005.  PCI to RCA in 2011 as well as 2015.  Heart catheterization in 2016 shows patent grafts and patent stents.  Myoview 2017 low risk.  He is off of both amlodipine and ranolazine.  Doing quite well without any anginal symptoms.  Prior symptoms were exertional dyspnea when biking semicompetitively.  Essential hypertension -Doing well  Hyperlipidemia -Has had follow-up with lipid clinic.  Statin, Zetia.   Medication Adjustments/Labs and Tests Ordered: Current medicines are reviewed at length with the patient today.  Concerns regarding medicines are outlined above.  No orders of the defined types were placed in this encounter.  No orders of the defined types were placed in this encounter.   There are no Patient Instructions on file for this visit.   Signed, Candee Furbish, MD  01/05/2020 12:55 PM    Story

## 2020-01-05 NOTE — Patient Instructions (Addendum)
Medication Instructions:  The current medical regimen is effective;  continue present plan and medications.  *If you need a refill on your cardiac medications before your next appointment, please call your pharmacy*  Follow-Up: Follow up with Dr Tamala Julian as previously scheduled.  At [facility], you and your health needs are our priority.  As part of our continuing mission to provide you with exceptional heart care, we have created designated Provider Care Teams.  These Care Teams include your primary Cardiologist (physician) and Advanced Practice Providers (APPs -  Physician Assistants and Nurse Practitioners) who all work together to provide you with the care you need, when you need it.  We recommend signing up for the patient portal called "MyChart".  Sign up information is provided on this After Visit Summary.  MyChart is used to connect with patients for Virtual Visits (Telemedicine).  Patients are able to view lab/test results, encounter notes, upcoming appointments, etc.  Non-urgent messages can be sent to your provider as well.   To learn more about what you can do with MyChart, go to [email or web address].    Thank you for choosing [facility]!!

## 2020-01-05 NOTE — Progress Notes (Signed)
See Dr. [name] note Mark Furbish, MD

## 2020-01-06 NOTE — Telephone Encounter (Signed)
Okay to hold Plavix. Okay to proceed with lithotripsy. Has no anginal complaints.

## 2020-01-09 NOTE — Telephone Encounter (Signed)
   Primary Cardiologist: Sinclair Grooms, MD  Chart reviewed as part of pre-operative protocol coverage. Review of chart indicates patient had a appt on 01/05/20 with Dr. [name] where pre op clearance was addressed. Patient is proceeding with scheduled procedure on 5/10.   I will remove from pre op pool.   Reino Bellis, NP 01/09/2020, 9:36 AM

## 2020-01-11 ENCOUNTER — Other Ambulatory Visit (HOSPITAL_COMMUNITY)
Admission: RE | Admit: 2020-01-11 | Discharge: 2020-01-11 | Disposition: A | Discharge status: Home / Self Care | Payer: BC Managed Care – PPO | Source: Ambulatory Visit | Attending: Urology | Admitting: Urology

## 2020-01-11 DIAGNOSIS — Z20822 Contact with and (suspected) exposure to covid-19: Secondary | ICD-10-CM | POA: Insufficient documentation

## 2020-01-11 DIAGNOSIS — Z01812 Encounter for preprocedural laboratory examination: Secondary | ICD-10-CM | POA: Insufficient documentation

## 2020-01-11 DIAGNOSIS — R8271 Bacteriuria: Secondary | ICD-10-CM | POA: Diagnosis not present

## 2020-01-11 DIAGNOSIS — R310 Gross hematuria: Secondary | ICD-10-CM | POA: Diagnosis not present

## 2020-01-11 DIAGNOSIS — N201 Calculus of ureter: Secondary | ICD-10-CM | POA: Diagnosis not present

## 2020-01-11 LAB — SARS CORONAVIRUS 2 (TAT 6-24 HRS): SARS Coronavirus 2: NEGATIVE

## 2020-01-12 NOTE — Progress Notes (Signed)
Patient to arrive at [facility] 01/15/20. History and medication list reviewed. Will be off plavix x 5 days and off ASA for 3 days pre-procedure..  Instructed to take BP medication with sip of water in the AM. NPO after midnight. All pre-procedure instructions given. Driver secured.

## 2020-01-15 ENCOUNTER — Encounter (HOSPITAL_BASED_OUTPATIENT_CLINIC_OR_DEPARTMENT_OTHER): Admission: RE | Payer: Self-pay | Source: Home / Self Care

## 2020-01-15 ENCOUNTER — Ambulatory Visit (HOSPITAL_BASED_OUTPATIENT_CLINIC_OR_DEPARTMENT_OTHER): Admission: RE | Admit: 2020-01-15 | Payer: BC Managed Care – PPO | Source: Home / Self Care | Admitting: Urology

## 2020-01-15 DIAGNOSIS — N201 Calculus of ureter: Secondary | ICD-10-CM | POA: Diagnosis not present

## 2020-01-15 SURGERY — LITHOTRIPSY, ESWL
Anesthesia: LOCAL | Laterality: Right

## 2020-04-02 DIAGNOSIS — Z20822 Contact with and (suspected) exposure to covid-19: Secondary | ICD-10-CM | POA: Diagnosis not present

## 2020-04-04 ENCOUNTER — Other Ambulatory Visit (HOSPITAL_COMMUNITY)
Admission: RE | Admit: 2020-04-04 | Discharge: 2020-04-04 | Disposition: A | Discharge status: Home / Self Care | Payer: BC Managed Care – PPO | Source: Ambulatory Visit | Attending: General Surgery | Admitting: General Surgery

## 2020-04-04 DIAGNOSIS — Z20822 Contact with and (suspected) exposure to covid-19: Secondary | ICD-10-CM | POA: Diagnosis not present

## 2020-04-04 LAB — SARS CORONAVIRUS 2 BY RT PCR (DIASORIN): SARS Coronavirus 2: NEGATIVE

## 2020-06-07 ENCOUNTER — Inpatient Hospital Stay: Payer: BC Managed Care – PPO

## 2020-06-07 ENCOUNTER — Other Ambulatory Visit: Payer: Self-pay | Admitting: Oncology

## 2020-06-07 ENCOUNTER — Other Ambulatory Visit: Payer: Self-pay

## 2020-06-07 ENCOUNTER — Inpatient Hospital Stay: Payer: BC Managed Care – PPO | Attending: Oncology | Admitting: Oncology

## 2020-06-07 VITALS — HR 65 | Temp 97.1°F | Resp 16 | Ht 69.0 in | Wt 186.0 lb

## 2020-06-07 DIAGNOSIS — D45 Polycythemia vera: Secondary | ICD-10-CM | POA: Insufficient documentation

## 2020-06-07 DIAGNOSIS — Z79899 Other long term (current) drug therapy: Secondary | ICD-10-CM | POA: Insufficient documentation

## 2020-06-07 DIAGNOSIS — Z7982 Long term (current) use of aspirin: Secondary | ICD-10-CM | POA: Insufficient documentation

## 2020-06-07 DIAGNOSIS — Z7902 Long term (current) use of antithrombotics/antiplatelets: Secondary | ICD-10-CM | POA: Insufficient documentation

## 2020-06-07 LAB — CBC WITH DIFFERENTIAL (CANCER CENTER ONLY)
Abs Immature Granulocytes: 0.02 10*3/uL (ref 0.00–0.07)
Basophils Absolute: 0.1 10*3/uL (ref 0.0–0.1)
Basophils Relative: 1 %
Eosinophils Absolute: 0.2 10*3/uL (ref 0.0–0.5)
Eosinophils Relative: 2 %
HCT: 42.3 % (ref 39.0–52.0)
Hemoglobin: 14.7 g/dL (ref 13.0–17.0)
Immature Granulocytes: 0 %
Lymphocytes Relative: 19 %
Lymphs Abs: 1.5 10*3/uL (ref 0.7–4.0)
MCH: 32.4 pg (ref 26.0–34.0)
MCHC: 34.8 g/dL (ref 30.0–36.0)
MCV: 93.2 fL (ref 80.0–100.0)
Monocytes Absolute: 0.6 10*3/uL (ref 0.1–1.0)
Monocytes Relative: 8 %
Neutro Abs: 5.7 10*3/uL (ref 1.7–7.7)
Neutrophils Relative %: 70 %
Platelet Count: 184 10*3/uL (ref 150–400)
RBC: 4.54 MIL/uL (ref 4.22–5.81)
RDW: 13 % (ref 11.5–15.5)
WBC Count: 8 10*3/uL (ref 4.0–10.5)
nRBC: 0 % (ref 0.0–0.2)

## 2020-06-07 NOTE — Progress Notes (Signed)
Hematology and Oncology Follow Up Visit  Mark YEATTS, MD [id number] 07/06/1956 64 y.o. 06/07/2020 1:06 PM Mark Payne, Mark Millin, MD   Principle Diagnosis: 64 year old with polycythemia noted in October 2020.  His work-up did not reveal any evidence of myeloproliferative disorder with the etiology appears to be secondary.  Myeloproliferative panel obtained December 07, 2019 did not show any genetic mutation.   Current therapy: Active surveillance.  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  Since the last visit, he reports no major changes in his health.  He has donated blood periodically around 1 to 2 units every 60 days.  He denies any chest pain or shortness of breath.  He denies any recent hospitalization or illnesses.  His performance status quality of life remains excellent and continues to work full-time.      Medications: I have reviewed the patient's current medications.  Current Outpatient Medications  Medication Sig Dispense Refill  . aspirin 81 MG tablet Take 81 mg by mouth daily.    . clopidogrel (PLAVIX) 75 MG tablet TAKE 1 TABLET BY MOUTH EVERY DAY 90 tablet 3  . ezetimibe (ZETIA) 10 MG tablet Take 1 tablet (10 mg total) by mouth daily. 30 tablet 11  . levothyroxine (SYNTHROID, LEVOTHROID) 150 MCG tablet Take 150 mcg by mouth daily before breakfast.    . nebivolol (BYSTOLIC) 2.5 MG tablet Take 2.5 mg by mouth daily.    . rosuvastatin (CRESTOR) 10 MG tablet Take 1 tablet (10 mg total) by mouth daily. 30 tablet 11   No current facility-administered medications for this visit.     Allergies:  Allergies  Allergen Reactions  . Vistaril [Hydroxyzine Hcl]     PATIENT HAD NAUSEA IT MADE HIM REALLY SICK  . Zocor [Simvastatin]     Cranial nerve palsy       Lab Results: Lab Results  Component Value Date   WBC 7.8 12/07/2019   HGB 16.8 12/07/2019   HCT 49.2 12/07/2019   MCV 96.5 12/07/2019   PLT 198 12/07/2019     Chemistry       Component Value Date/Time   NA 137 11/28/2014 1352   K 4.2 11/28/2014 1352   CL 105 11/28/2014 1352   CO2 30 11/28/2014 1352   BUN 13 11/28/2014 1352   CREATININE 1.44 11/28/2014 1352      Component Value Date/Time   CALCIUM 9.5 11/28/2014 1352   ALKPHOS 70 04/03/2016 1230   AST 27 04/03/2016 1230   ALT 30 04/03/2016 1230   BILITOT 0.8 04/03/2016 1230        Impression and Plan:   64 year old man with:  1.    Fluctuating erythrocytosis dating back to October 2020 with work-up revealed no evidence of myeloproliferative disorder.    Laboratory data dating back to 2011 were reviewed with a hemoglobin on April 1 of 2021 16.8 and hematocrit of 49.2.  His erythropoietin level is within normal range.  His myeloproliferative neoplasm work-up revealed no genetic mutation including JAK2, MPL, CALR among many others.  The differential diagnosis of elevated hemoglobin was discussed again.  His elevation appears to be fluctuating and secondary in nature.  Management options include continued active surveillance and therapeutic phlebotomy.  I see no indication for phlebotomy at this time given the mild elevation in his hemoglobin and likely secondary causes.  I do not recommend any additional work-up or evaluation at this time.  I recommended continuing blood donation periodically.  Further evaluation with a bone  marrow biopsy is not indicated at this time but could be considered if he develops worsening counts in the future including leukocytosis, thrombocytosis or other abnormalities.   2.  Thrombosis prophylaxis: His risk of thrombosis is low currently on aspirin and Plavix.   3.  Follow-up: I am happy to see him in the future as needed.   20 minutes were spent on this encounter.  The time was dedicated to reviewing laboratory data, discussing differential diagnosis and management options for the future.     Zola Button, MD 10/1/20211:06 PM

## 2020-09-26 ENCOUNTER — Telehealth: Payer: Self-pay

## 2020-09-26 NOTE — Telephone Encounter (Signed)
Fine with me, thank you. Candee Furbish, MD

## 2020-09-26 NOTE — Telephone Encounter (Signed)
I totally agree. Perfect switch!

## 2020-09-26 NOTE — Telephone Encounter (Signed)
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 He saw Dr. [name] last April and would like to establish with him (from Dr. Tamala Julian). He requests the switch because he and Dr. 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  He has been scheduled 1/26 with Dr. [name] per his request, but understands the provider switch policy and that he will be called if appointment needs to be changed. He was grateful for call and agrees with plan.   To Drs. Tamala Julian and [place] for agreement.

## 2020-10-02 ENCOUNTER — Telehealth: Payer: BC Managed Care – PPO | Admitting: Cardiology

## 2020-10-03 ENCOUNTER — Telehealth: Payer: Self-pay | Admitting: *Deleted

## 2020-10-03 ENCOUNTER — Other Ambulatory Visit: Payer: Self-pay

## 2020-10-03 ENCOUNTER — Telehealth (INDEPENDENT_AMBULATORY_CARE_PROVIDER_SITE_OTHER): Payer: BC Managed Care – PPO | Admitting: Cardiology

## 2020-10-03 ENCOUNTER — Encounter: Payer: Self-pay | Admitting: Cardiology

## 2020-10-03 VITALS — BP 128/78 | HR 56 | Ht 69.0 in | Wt 178.0 lb

## 2020-10-03 DIAGNOSIS — I2510 Atherosclerotic heart disease of native coronary artery without angina pectoris: Secondary | ICD-10-CM | POA: Diagnosis not present

## 2020-10-03 DIAGNOSIS — E7800 Pure hypercholesterolemia, unspecified: Secondary | ICD-10-CM | POA: Diagnosis not present

## 2020-10-03 DIAGNOSIS — Z951 Presence of aortocoronary bypass graft: Secondary | ICD-10-CM | POA: Diagnosis not present

## 2020-10-03 MED ORDER — ROSUVASTATIN CALCIUM 20 MG PO TABS
20.0000 mg | ORAL_TABLET | Freq: Every day | ORAL | 3 refills | Status: AC
Start: 2020-10-03 — End: ?

## 2020-10-03 NOTE — Telephone Encounter (Signed)
  Patient Consent for Virtual Visit         Mark Antigua, MD has provided verbal consent on 10/03/2020 for a virtual visit (video or telephone).   CONSENT FOR VIRTUAL VISIT FOR:  Mark Antigua, MD  By participating in this virtual visit I agree to the following:  I hereby voluntarily request, consent and authorize [facility] and its employed or contracted physicians, physician assistants, nurse practitioners or other licensed health care professionals (the Practitioner), to provide me with telemedicine health care services (the "Services") as deemed necessary by the treating Practitioner. I acknowledge and consent to receive the Services by the Practitioner via telemedicine. I understand that the telemedicine visit will involve communicating with the Practitioner through live audiovisual communication technology and the disclosure of certain medical information by electronic transmission. I acknowledge that I have been given the opportunity to request an in-person assessment or other available alternative prior to the telemedicine visit and am voluntarily participating in the telemedicine visit.  I understand that I have the right to withhold or withdraw my consent to the use of telemedicine in the course of my care at any time, without affecting my right to future care or treatment, and that the Practitioner or I may terminate the telemedicine visit at any time. I understand that I have the right to inspect all information obtained and/or recorded in the course of the telemedicine visit and may receive copies of available information for a reasonable fee.  I understand that some of the potential risks of receiving the Services via telemedicine include:  Marland Kitchen Delay or interruption in medical evaluation due to technological equipment failure or disruption; . Information transmitted may not be sufficient (e.g. poor resolution of images) to allow for appropriate medical decision making by the  Practitioner; and/or  . In rare instances, security protocols could fail, causing a breach of personal health information.  Furthermore, I acknowledge that it is my responsibility to provide information about my medical history, conditions and care that is complete and accurate to the best of my ability. I acknowledge that Practitioner's advice, recommendations, and/or decision may be based on factors not within their control, such as incomplete or inaccurate data provided by me or distortions of diagnostic images or specimens that may result from electronic transmissions. I understand that the practice of medicine is not an exact science and that Practitioner makes no warranties or guarantees regarding treatment outcomes. I acknowledge that a copy of this consent can be made available to me via my patient portal ([facility]), or I can request a printed copy by calling the office of [facility].    I understand that my insurance will be billed for this visit.   I have read or had this consent read to me. . I understand the contents of this consent, which adequately explains the benefits and risks of the Services being provided via telemedicine.  . I have been provided ample opportunity to ask questions regarding this consent and the Services and have had my questions answered to my satisfaction. . I give my informed consent for the services to be provided through the use of telemedicine in my medical care

## 2020-10-03 NOTE — Patient Instructions (Signed)
Medication Instructions:  Please increase your Crestor to 20 mg a day.  Continue all other medications as listed.  *If you need a refill on your cardiac medications before your next appointment, please call your pharmacy*  Follow-Up: At [facility], you and your health needs are our priority.  As part of our continuing mission to provide you with exceptional heart care, we have created designated Provider Care Teams.  These Care Teams include your primary Cardiologist (physician) and Advanced Practice Providers (APPs -  Physician Assistants and Nurse Practitioners) who all work together to provide you with the care you need, when you need it.  We recommend signing up for the patient portal called "MyChart".  Sign up information is provided on this After Visit Summary.  MyChart is used to connect with patients for Virtual Visits (Telemedicine).  Patients are able to view lab/test results, encounter notes, upcoming appointments, etc.  Non-urgent messages can be sent to your provider as well.   To learn more about what you can do with MyChart, go to [email or web address].    Your next appointment:   6 month(s)  The format for your next appointment:   In Person  Provider:   Candee Furbish, MD   Thank you for choosing [facility]!!

## 2020-10-03 NOTE — Progress Notes (Signed)
Virtual Visit via Video Note   This visit type was conducted due to national recommendations for restrictions regarding the COVID-19 Pandemic (e.g. social distancing) in an effort to limit this patient's exposure and mitigate transmission in our community.  Due to his co-morbid illnesses, this patient is at least at moderate risk for complications without adequate follow up.  This format is felt to be most appropriate for this patient at this time.  All issues noted in this document were discussed and addressed.  A limited physical exam was performed with this format.  Please refer to the patient's chart for his consent to telehealth for [facility].       Date:  10/03/2020   ID:  Mark Antigua, Mark Payne, DOB 1955/11/10, MRN [id number] The patient was identified using 2 identifiers.  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 Mallie Mussel Smith's here for follow-up of coronary artery disease.  I had seen him previously on 01/05/2020 for preoperative evaluation..  Recently, had an episode of chest pressure lasting a few hours duration, nonexertional, NTG spray. Went away gradually.  Has had occasional dyspepsia.   He underwent CABG in 2005 and subsequent PCI to the RCA x 2 with a DES (2011 and 2015). LHC in 3/16 demonstrated patent bypass grafts and patent RCA stents. Nuclear stress test in 4/17 demonstrated anteroseptal, apical, inferior lateral defect consistent with LBBB artifact versus prior anteroseptal/apical infarct and prior inferior lateral infarct with mild peri-infarct ischemia, EF 51. It was felt that his study demonstrated similar findings to his prior evaluations. Medical therapy was  continued  The patient does not have symptoms concerning for COVID-19 infection (fever, chills, cough, or new shortness of breath).    Past Medical History:  Diagnosis Date  . Coronary artery disease involving native coronary artery with angina pectoris ([place]) 04/02/2014   LIMA to LAD and free radial to diagonal #1 20075 // s/p PCI of RCA with DES May 2011 and DES to RCA 05/18/14 // MV 11/13/14: Low risk with significant artifacts but no ischemia.  EF 53 // LHC 3/16: pLAD 95, 80; LCx irregs; pRCA stents ok, mRCA 40-50, L-LAD ok, free Rad-Dx ok, EF 45-50 // MV 4/17: ant-sept, apical defect, partially reversible inf-lat defect, EF 51 (no change from prior) >> Med Rx.   . Essential hypertension 04/02/2014  . History of echocardiogram    Echo 4/18: normal LV systolic function (EF 56), no RWMA  . Hyperlipidemia 04/02/2014  . LBBB (left bundle branch block) 04/02/2014  . OSA (obstructive sleep apnea) 04/11/2018   Past Surgical History:  Procedure Laterality Date  . LEFT HEART CATHETERIZATION WITH CORONARY/GRAFT ANGIOGRAM N/A 11/13/2011   Procedure: LEFT HEART CATHETERIZATION WITH Beatrix Fetters;  Surgeon: Sinclair Grooms, Mark Payne;  Location[facility] CATH LAB;  Service: Cardiovascular;  Laterality: N/A;  . LEFT HEART CATHETERIZATION WITH CORONARY/GRAFT ANGIOGRAM N/A 05/18/2014   Procedure: LEFT HEART CATHETERIZATION WITH Beatrix Fetters;  Surgeon: Sinclair Grooms, Mark Payne;  Location: [facility] CATH LAB;  Service: Cardiovascular;  Laterality: N/A;  . LEFT HEART CATHETERIZATION WITH CORONARY/GRAFT ANGIOGRAM N/A 11/30/2014   Procedure: LEFT HEART CATHETERIZATION WITH Beatrix Fetters;  Surgeon: Belva Crome, Mark Payne;  Location: [facility]  CATH LAB;  Service: Cardiovascular;  Laterality: N/A;     Current Meds  Medication Sig  . aspirin 81 MG tablet Take 81 mg by mouth daily.  . clopidogrel (PLAVIX) 75 MG tablet TAKE 1 TABLET BY MOUTH EVERY DAY  . ezetimibe (ZETIA) 10 MG tablet Take 1 tablet (10 mg total) by mouth  daily.  Marland Kitchen levothyroxine (SYNTHROID, LEVOTHROID) 150 MCG tablet Take 150 mcg by mouth daily before breakfast.  . nebivolol (BYSTOLIC) 2.5 MG tablet Take 2.5 mg by mouth daily.  . rosuvastatin (CRESTOR) 20 MG tablet Take 1 tablet (20 mg total) by mouth daily.  . [DISCONTINUED] rosuvastatin (CRESTOR) 10 MG tablet Take 1 tablet (10 mg total) by mouth daily.     Allergies:   Vistaril [hydroxyzine hcl] and Zocor [simvastatin]   Social History   Tobacco Use  . Smoking status: Former Research scientist (life sciences)  . Smokeless tobacco: Never Used  Vaping Use  . Vaping Use: Never used  Substance Use Topics  . Alcohol use: Yes  . Drug use: No     Family Hx: The patient's family history includes Breast cancer in his mother; CAD in his father; Healthy in his brother, brother, and brother; Heart disease in his mother.  ROS:   Please see the history of present illness.     All other systems reviewed and are negative.   Prior CV studies:   The following studies were reviewed today:   Echo 12/14/16 Normal LV function, normal wall motion  Myoview 12/10/15 Low risk stress nuclear study with large, severe, fixed anteroseptal/apical defect and large, severe, partially reversible inferior lateral defect; findings consistent with probable LBBB artifact vs prior anteroseptal/apical infarct and prior inferior lateral infarct with mild peri-infarct ischemia; EF 51 with mild global hypokinesis and mild LVE.  LHC 11/30/14 LM patent LAD prox 95% before stent, 80% after stent LCx irregularities (nothing > 50%) RCA prox to mid stent ok, mid 40-50% L-LAD patent Free Radial-Dx patent EF 45-50%  Myoview 11/13/14 Low risk stress nuclear study with significant artifacts but no ischemia.LV Ejection Fraction: 53%.LV Wall Motion:Paradoxical septal motion.    Labs/Other Tests and Data Reviewed:    EKG: 01/05/2020-SB 49 LBBB  Recent Labs: 06/07/2020: Hemoglobin 14.7; Platelet Count 184   Recent Lipid Panel Lab  Results  Component Value Date/Time   CHOL 116 12/22/2016 12:00 AM   TRIG 117 12/22/2016 12:00 AM   HDL 46 12/22/2016 12:00 AM   CHOLHDL 2.5 12/22/2016 12:00 AM   CHOLHDL 2.4 04/03/2016 12:30 PM   LDLCALC 47 12/22/2016 12:00 AM   LDLDIRECT 58 12/22/2016 12:00 AM    Wt Readings from Last 3 Encounters:  10/03/20 178 lb (80.7 kg)  06/07/20 186 lb (84.4 kg)  01/05/20 192 lb (87.1 kg)     Risk Assessment/Calculations:     Objective:    Vital Signs:  BP 128/78   Pulse (!) 56   Ht 5\' 9"  (1.753 m)   Wt 178 lb (80.7 kg)   BMI 26.29 kg/m    VITAL SIGNS:  reviewed GEN:  no acute distress EYES:  sclerae anicteric, EOMI - Extraocular Movements Intact RESPIRATORY:  normal respiratory effort, symmetric expansion SKIN:  no rash, lesions or ulcers. MUSCULOSKELETAL:  no obvious deformities. NEURO:  alert and oriented x 3, no obvious focal deficit PSYCH:  normal affect  ASSESSMENT & PLAN:    Coronary artery disease status post CABG -Prior bypass 2005.  LIMA to LAD, free radial to ramus intermedius. -PCI to RCA in 2011 as  well as 2015. -Heart catheterization 2016 showed patent grafts and stents. -Nuclear stress test 2018 no change. -He is off of his amlodipine and Ranexa.  Doing well, no anginal symptoms.  Previously he noted anginal symptoms when biking semicompetitively. --Bystolic 2.5 QD --If he continues to experience increasing anginal type symptoms, we will have low threshold to proceed with cardiac catheterization.  He will let us know if symptoms are changing. -For now we will continue with dual antiplatelet therapy since he had this anginal type episode.  He is concerned however of bleeding risks especially if he falls on a road bike.  His father was on Plavix and had a subdural bleed.  In the future, consider Plavix monotherapy given his complex coronary disease history.  Essential hypertension -Blood pressure has never been much of an issue.  He does take a low-dose of  Bystolic.  Hyperlipidemia -On both statin as well as Zetia.  Has had follow-up in the past with lipid clinic. --increase to Crestor 20, high intensity dose.  Polycythemia vera -Followed by [name] -Myeloproliferative panel in April 2021 did not show any genetic mutation.  No further work-up necessary.  Occasional blood donation noted.  Obstructive sleep apnea -CPAP.  Followed by pulmonary, Dr. Halford Chessman    Left bundle branch block -Sinus bradycardia also noted.  Monitor closely with low-dose Bystolic.  COVID-19 Education: The signs and symptoms of COVID-19 were discussed with the patient and how to seek care for testing (follow up with PCP or arrange E-visit).  The importance of social distancing was discussed today.  Time:   Today, I have spent 21 minutes with the patient with telehealth technology discussing the above problems.     Medication Adjustments/Labs and Tests Ordered: Current medicines are reviewed at length with the patient today.  Concerns regarding medicines are outlined above.   Tests Ordered: No orders of the defined types were placed in this encounter.   Medication Changes: Meds ordered this encounter  Medications  . rosuvastatin (CRESTOR) 20 MG tablet    Sig: Take 1 tablet (20 mg total) by mouth daily.    Dispense:  90 tablet    Refill:  3    Follow Up:  In Person in 6 month(s)  Signed, Candee Furbish, Mark Payne  10/03/2020 11:27 AM    [place]

## 2020-12-04 ENCOUNTER — Other Ambulatory Visit (HOSPITAL_COMMUNITY): Payer: Self-pay | Admitting: Internal Medicine

## 2020-12-04 MED FILL — MOLNUPIRAVIR 200 MG CAPS: 200 | 5 days supply | Qty: 40 | Fill #0

## 2021-04-14 DIAGNOSIS — Z86010 Personal history of colonic polyps: Secondary | ICD-10-CM | POA: Diagnosis not present

## 2021-04-14 DIAGNOSIS — K219 Gastro-esophageal reflux disease without esophagitis: Secondary | ICD-10-CM | POA: Diagnosis not present

## 2021-04-14 DIAGNOSIS — R194 Change in bowel habit: Secondary | ICD-10-CM | POA: Diagnosis not present

## 2021-04-17 ENCOUNTER — Telehealth: Payer: Self-pay | Admitting: *Deleted

## 2021-04-17 NOTE — Telephone Encounter (Signed)
   [facility] Pre-operative Risk Assessment    Patient Name: Mark LALL, MD  DOB: 10/13/1955 MRN: [id number]  HEARTCARE STAFF:  - IMPORTANT!!!!!! Under Visit Info/Reason for Call, type in Other and utilize the format Clearance MM/DD/YY or Clearance TBD. Do not use dashes or single digits. - Please review there is not already an duplicate clearance open for this procedure. - If request is for dental extraction, please clarify the # of teeth to be extracted. - If the patient is currently at the dentist's office, call Pre-Op Callback Staff (MA/nurse) to input urgent request.  - If the patient is not currently in the dentist office, please route to the Pre-Op pool.  Request for surgical clearance:  What type of surgery is being performed? COLONOSCOPY/ENDOSCOPY  When is this surgery scheduled? 05/21/21  What type of clearance is required (medical clearance vs. Pharmacy clearance to hold med vs. Both)? MEDICAL  Are there any medications that need to be held prior to surgery and how long?  PLAVIX   Practice name and name of physician performing surgery? [facility]; DR. Cristina Payne  What is the office phone number? [phone]   7.   What is the office fax number? [phone]  8.   Anesthesia type (None, local, MAC, general) ? PROPOFOL   Mark Payne 04/17/2021, 5:42 PM  _________________________________________________________________   (provider comments below)

## 2021-04-18 NOTE — Telephone Encounter (Signed)
   Name: Mark Antigua, MD  DOB: [birth year]  MRN: [id number]  Primary Cardiologist: Candee Furbish, MD  Chart reviewed as part of pre-operative protocol coverage. Mark Antigua, MD will require a follow-up visit in order to better assess preoperative cardiovascular risk. He is overdue for 6 month follow-up. Given procedure is >58moout, favor in office visit.   Pre-op covering staff: - Please schedule appointment and call patient to inform them. Please add "pre-op clearance" to the appointment notes so provider is aware. - Please contact requesting surgeon's office via preferred method (i.e, phone, fax) to inform them of need for appointment prior to surgery.  Per previous recommendation by Dr. SMarlou Porch patient was cleared to hold plavix 5-7 days if needed. As long as he is not having progressive anginal symptoms at his follow-up visit, the same recommendation should apply.   KAbigail Butts PA-C  04/18/2021, 12:01 PM

## 2021-04-21 NOTE — Telephone Encounter (Signed)
Pt aware of needing an appt for pre op clearance. Pt agreeable to appt at [facility] office due to availability. Appt has been set to see Almyra Deforest, [facility] 05/15/21 @ 2:15 [facility] location. Pt aware of address for [facility]. I will forward notes to [facility] for upcoming appt. Will send FYI to surgeon's office pt ha appt 05/15/21.

## 2021-05-15 ENCOUNTER — Ambulatory Visit: Payer: BC Managed Care – PPO | Admitting: Physician Assistant

## 2021-05-19 ENCOUNTER — Encounter: Payer: Self-pay | Admitting: Physician Assistant

## 2021-05-19 ENCOUNTER — Ambulatory Visit (INDEPENDENT_AMBULATORY_CARE_PROVIDER_SITE_OTHER): Payer: BC Managed Care – PPO | Admitting: Physician Assistant

## 2021-05-19 ENCOUNTER — Other Ambulatory Visit: Payer: Self-pay

## 2021-05-19 VITALS — BP 122/78 | HR 56 | Ht 68.0 in | Wt 187.6 lb

## 2021-05-19 DIAGNOSIS — I10 Essential (primary) hypertension: Secondary | ICD-10-CM | POA: Diagnosis not present

## 2021-05-19 DIAGNOSIS — I25810 Atherosclerosis of coronary artery bypass graft(s) without angina pectoris: Secondary | ICD-10-CM

## 2021-05-19 DIAGNOSIS — E785 Hyperlipidemia, unspecified: Secondary | ICD-10-CM | POA: Diagnosis not present

## 2021-05-19 DIAGNOSIS — Z01818 Encounter for other preprocedural examination: Secondary | ICD-10-CM

## 2021-05-19 NOTE — Progress Notes (Signed)
Cardiology Office Note:    Date:  05/20/2021   ID:  Mark Antigua, MD, DOB 28-Oct-1955, MRN [id number]  PCP:  Mark Solian, MD   [facility] Providers Cardiologist:  Candee Furbish, MD     Referring MD: Mark Solian, MD   Chief Complaint  Patient presents with   Pre-op Exam    Pending GI procedure.      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  He was a prior patient of Dr. Tamala Julian and later set up with Dr. [name].  Since bypass surgery, he underwent PCI to RCA x2 with drug-eluting stent in 2011 and 2015.  Left heart cath in March 2016 demonstrated patent grafts with patent RCA stents.  Myoview in April 2017 demonstrated anteroseptal, apical, inferolateral defect consistent with left bundle branch block artifact versus prior anterior septal and apical infarct and prior inferior lateral infarct with mild peri-infarct ischemia, EF 51%.  The finding in the study is similar to the previous study, therefore medical therapy was recommended.  Patient was last seen virtually by Dr. [name] in January 2022 at which time he had mild intermittent chest discomfort.  Patient presents today for preoperative clearance prior to GI procedure. He has been holding plavix since last Thursday. He says the previous mild atypical chest pain quickly resolved and has not occurred within the past few month. He is able to swim for more than a mile on a daily basis without exertional chest pain or worsening dyspnea. He clearly able to accomplish more than 4 mets of activity. He is cleared to proceed with GI procedure from the cardiac perspective. Otherwise he has no LE edema, orthopnea or PND.   Past Medical History:  Diagnosis Date   Coronary artery disease involving native coronary artery with angina pectoris ([place])  04/02/2014   LIMA to LAD and free radial to diagonal #1 20075 // s/p PCI of RCA with DES May 2011 and DES to RCA 05/18/14 // MV 11/13/14: Low risk with significant artifacts but no ischemia.  EF 53 // LHC 3/16: pLAD 95, 80; LCx irregs; pRCA stents ok, mRCA 40-50, L-LAD ok, free Rad-Dx ok, EF 45-50 // MV 4/17: ant-sept, apical defect, partially reversible inf-lat defect, EF 51 (no change from prior) >> Med Rx.    Essential hypertension 04/02/2014   History of echocardiogram    Echo 4/18: normal LV systolic function (EF 56), no RWMA   Hyperlipidemia 04/02/2014   LBBB (left bundle branch block) 04/02/2014   OSA (obstructive sleep apnea) 04/11/2018    Past Surgical History:  Procedure Laterality Date   LEFT HEART CATHETERIZATION WITH CORONARY/GRAFT ANGIOGRAM N/A 11/13/2011   Procedure: LEFT HEART CATHETERIZATION WITH Beatrix Fetters;  Surgeon: Sinclair Grooms, MD;  Location: [facility] CATH LAB;  Service: Cardiovascular;  Laterality: N/A;   LEFT HEART CATHETERIZATION WITH CORONARY/GRAFT ANGIOGRAM N/A 05/18/2014   Procedure: LEFT HEART CATHETERIZATION WITH Beatrix Fetters;  Surgeon: Sinclair Grooms, MD;  Location: [facility] CATH LAB;  Service: Cardiovascular;  Laterality: N/A;   LEFT HEART CATHETERIZATION WITH CORONARY/GRAFT ANGIOGRAM N/A 11/30/2014   Procedure: LEFT HEART CATHETERIZATION WITH Beatrix Fetters;  Surgeon: Belva Crome, MD;  Location[facility] CATH LAB;  Service: Cardiovascular;  Laterality: N/A;    Current Medications: Current Meds  Medication Sig  aspirin 81 MG tablet Take 81 mg by mouth daily.   clopidogrel (PLAVIX) 75 MG tablet TAKE 1 TABLET BY MOUTH EVERY DAY   ezetimibe (ZETIA) 10 MG tablet Take 1 tablet (10 mg total) by mouth daily.   levothyroxine (SYNTHROID, LEVOTHROID) 150 MCG tablet Take 150 mcg by mouth daily before breakfast.   nebivolol (BYSTOLIC) 5 MG tablet Take 1 qd (uses days that works, uses 3-4 days a week)   rosuvastatin (CRESTOR) 20 MG tablet Take 1 tablet  (20 mg total) by mouth daily.     Allergies:   Vistaril [hydroxyzine hcl] and Zocor [simvastatin]   Social History   Socioeconomic History   Marital status: Married    Spouse name: Not on file   Number of children: Not on file   Years of education: Not on file   Highest education level: Not on file  Occupational History   Occupation: physician    Comment: Anesthesiologist  Tobacco Use   Smoking status: Former   Smokeless tobacco: Never  Scientific laboratory technician Use: Never used  Substance and Sexual Activity   Alcohol use: Yes   Drug use: No   Sexual activity: Yes  Other Topics Concern   Not on file  Social History Narrative   Not on file   Social Determinants of Health   Financial Resource Strain: Not on file  Food Insecurity: Not on file  Transportation Needs: Not on file  Physical Activity: Not on file  Stress: Not on file  Social Connections: Not on file     Family History: The patient's family history includes Breast cancer in his mother; CAD in his father; Healthy in his brother, brother, and brother; Heart disease in his mother.  ROS:   Please see the history of present illness.     All other systems reviewed and are negative.  EKGs/Labs/Other Studies Reviewed:    The following studies were reviewed today:  Echo 12/14/2016 Study Conclusions   - Left ventricle: The cavity size was normal. Systolic function was    normal. Wall motion was normal; there were no regional wall    motion abnormalities.  - Ventricular septum: Septal motion showed paradox. These changes    are consistent with a left bundle branch block.  - Atrial septum: No defect or patent foramen ovale was identified.    Myoview 05/11/2017 Nuclear stress EF: 50%. There was no ST segment deviation noted during stress. No T wave inversion was noted during stress. Defect 1: There is a large defect of moderate severity present in the basal anteroseptal, mid anterior, mid anteroseptal, apical septal  and apex location. Findings consistent with prior myocardial infarction. This is an intermediate risk study. The left ventricular ejection fraction is mildly decreased (45-54%). Defect 2: There is a medium defect of moderate severity present in the basal inferoseptal, basal inferior, mid inferoseptal and mid inferior location.   EKG:  EKG is ordered today.  The ekg ordered today demonstrates NSR without significant ST-T wave changes  Recent Labs: 06/07/2020: Hemoglobin 14.7; Platelet Count 184  Recent Lipid Panel    Component Value Date/Time   CHOL 116 12/22/2016 0000   TRIG 117 12/22/2016 0000   HDL 46 12/22/2016 0000   CHOLHDL 2.5 12/22/2016 0000   CHOLHDL 2.4 04/03/2016 1230   VLDL 22 04/03/2016 1230   LDLCALC 47 12/22/2016 0000   LDLDIRECT 58 12/22/2016 0000     Risk Assessment/Calculations:           Physical  Exam:    VS:  BP 122/78   Pulse (!) 56   Ht '5\' 8"'$  (1.727 m)   Wt 187 lb 9.6 oz (85.1 kg)   SpO2 97%   BMI 28.52 kg/m     Wt Readings from Last 3 Encounters:  05/19/21 187 lb 9.6 oz (85.1 kg)  10/03/20 178 lb (80.7 kg)  06/07/20 186 lb (84.4 kg)     GEN:  Well nourished, well developed in no acute distress HEENT: Normal NECK: No JVD; No carotid bruits LYMPHATICS: No lymphadenopathy CARDIAC: RRR, no murmurs, rubs, gallops RESPIRATORY:  Clear to auscultation without rales, wheezing or rhonchi  ABDOMEN: Soft, non-tender, non-distended MUSCULOSKELETAL:  No edema; No deformity  SKIN: Warm and dry NEUROLOGIC:  Alert and oriented x 3 PSYCHIATRIC:  Normal affect   ASSESSMENT:    1. Preop examination   2. Coronary artery disease involving coronary bypass graft of native heart without angina pectoris   3. Essential hypertension   4. Hyperlipidemia LDL goal <70    PLAN:    In order of problems listed above:  Preoperative clearance: given the fact he is able to accomplish more than 4 METS of activity, he is cleared to proceed with GI procedure as a low  risk candidate for a low risk procedure. He may hold plavix for 5-7 days prior to the procedure and restart as soon as possible afterward at the GI physician's discretion.  CAD s/p CABG: denies any recent ASA and plavix, will defer to MD to consider discontinue plavix in the future since last PCI was many years ago  Hypertension: blood pressure well controlled  Hyperlipidemia: on zetia and crestor, will defer annual lab work to PCP, LDL goal < 70.         Medication Adjustments/Labs and Tests Ordered: Current medicines are reviewed at length with the patient today.  Concerns regarding medicines are outlined above.  Orders Placed This Encounter  Procedures   EKG 12-Lead   No orders of the defined types were placed in this encounter.   Patient Instructions  Medication Instructions:  Your physician recommends that you continue on your current medications as directed. Please refer to the Current Medication list given to you today.  *If you need a refill on your cardiac medications before your next appointment, please call your pharmacy*   Lab Work: NONE ordered at this time of appointment   If you have labs (blood work) drawn today and your tests are completely normal, you will receive your results only by: [place] (if you have MyChart) OR A paper copy in the mail If you have any lab test that is abnormal or we need to change your treatment, we will call you to review the results.   Testing/Procedures: NONE ordered at this time of appointment     Follow-Up: At [facility], you and your health needs are our priority.  As part of our continuing mission to provide you with exceptional heart care, we have created designated Provider Care Teams.  These Care Teams include your primary Cardiologist (physician) and Advanced Practice Providers (APPs -  Physician Assistants and Nurse Practitioners) who all work together to provide you with the care you need, when you need  it.  Your next appointment:   6 month(s)  The format for your next appointment:   In Person  Provider:   Candee Furbish, MD  Other Instructions    Signed, Almyra Deforest, [place]  05/20/2021 12:20 AM    [facility]

## 2021-05-19 NOTE — Patient Instructions (Signed)
Medication Instructions:  Your physician recommends that you continue on your current medications as directed. Please refer to the Current Medication list given to you today.  *If you need a refill on your cardiac medications before your next appointment, please call your pharmacy*   Lab Work: NONE ordered at this time of appointment   If you have labs (blood work) drawn today and your tests are completely normal, you will receive your results only by: [place] (if you have MyChart) OR A paper copy in the mail If you have any lab test that is abnormal or we need to change your treatment, we will call you to review the results.   Testing/Procedures: NONE ordered at this time of appointment     Follow-Up: At [facility], you and your health needs are our priority.  As part of our continuing mission to provide you with exceptional heart care, we have created designated Provider Care Teams.  These Care Teams include your primary Cardiologist (physician) and Advanced Practice Providers (APPs -  Physician Assistants and Nurse Practitioners) who all work together to provide you with the care you need, when you need it.  Your next appointment:   6 month(s)  The format for your next appointment:   In Person  Provider:   Candee Furbish, MD  Other Instructions

## 2021-05-20 ENCOUNTER — Encounter: Payer: Self-pay | Admitting: Physician Assistant

## 2021-05-20 NOTE — Telephone Encounter (Signed)
Dr. Ambrose Pancoast was seen in the cardiology office and cleared to proceed with GI procedure. See office note. I will forward office note to requesting provider.

## 2021-05-21 DIAGNOSIS — Z86010 Personal history of colonic polyps: Secondary | ICD-10-CM | POA: Diagnosis not present

## 2021-05-21 DIAGNOSIS — D120 Benign neoplasm of cecum: Secondary | ICD-10-CM | POA: Diagnosis not present

## 2021-05-21 DIAGNOSIS — K2100 Gastro-esophageal reflux disease with esophagitis, without bleeding: Secondary | ICD-10-CM | POA: Diagnosis not present

## 2021-10-01 DIAGNOSIS — R051 Acute cough: Secondary | ICD-10-CM | POA: Diagnosis not present

## 2021-10-01 DIAGNOSIS — J40 Bronchitis, not specified as acute or chronic: Secondary | ICD-10-CM | POA: Diagnosis not present

## 2021-12-09 ENCOUNTER — Encounter: Payer: Self-pay | Admitting: Cardiology

## 2021-12-09 ENCOUNTER — Ambulatory Visit (INDEPENDENT_AMBULATORY_CARE_PROVIDER_SITE_OTHER): Payer: BC Managed Care – PPO | Admitting: Cardiology

## 2021-12-09 VITALS — BP 106/62 | HR 53 | Ht 69.0 in | Wt 184.0 lb

## 2021-12-09 DIAGNOSIS — E7800 Pure hypercholesterolemia, unspecified: Secondary | ICD-10-CM

## 2021-12-09 DIAGNOSIS — Z01810 Encounter for preprocedural cardiovascular examination: Secondary | ICD-10-CM | POA: Diagnosis not present

## 2021-12-09 DIAGNOSIS — I25119 Atherosclerotic heart disease of native coronary artery with unspecified angina pectoris: Secondary | ICD-10-CM | POA: Diagnosis not present

## 2021-12-09 DIAGNOSIS — R0602 Shortness of breath: Secondary | ICD-10-CM

## 2021-12-09 DIAGNOSIS — D45 Polycythemia vera: Secondary | ICD-10-CM

## 2021-12-09 DIAGNOSIS — I447 Left bundle-branch block, unspecified: Secondary | ICD-10-CM | POA: Diagnosis not present

## 2021-12-09 DIAGNOSIS — G4733 Obstructive sleep apnea (adult) (pediatric): Secondary | ICD-10-CM

## 2021-12-09 NOTE — Assessment & Plan Note (Addendum)
On Zetia and Crestor.  LDL goal less than 70, could consider less than 55.  Previous LDL in the 40s.  Excellent.  No myalgias.  Has had doctors day labs recently. ?

## 2021-12-09 NOTE — H&P (View-Only) (Signed)
? ?Cardiology Office Note  ? ?Date:  12/09/2021  ? ?ID:  Mark Antigua, MD, DOB [birth year], MRN [id number] ? ?PCP:  [name], MD  ?Cardiologist:  Candee Furbish, MD  ?Electrophysiologist:  None  ? ?Evaluation Performed:  Follow-Up Visit ? ?Chief Complaint: Angina ? ?History of Present Illness:   ? 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 ? 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 ? ?I had seen him previously on 01/05/2020 for preoperative evaluation.. ? ?He underwent CABG in 2005 and subsequent PCI to the RCA x 2 with a DES (2011 and 2015).  LHC in 3/16 demonstrated patent bypass grafts and patent RCA stents. Nuclear stress test in 4/17 demonstrated anteroseptal, apical, inferior lateral defect consistent with LBBB artifact versus prior anteroseptal/apical infarct and prior inferior lateral infarct with mild peri-infarct ischemia, EF 51. It was felt that his study demonstrated similar findings to his prior evaluations. Medical therapy was continued ? ?At his last appointment, he reported an episode of chest pressure lasting a few hours duration, nonexertional, NTG spray. Went away gradually.  Has had occasional dyspepsia.  ? ?Today: ?Yesterday and today, he is feeling a lot better overall. However, since last Wednesday or Thursday, he is suffering from random, intermittent chest pain. This was worse last weekend. He notes that the pain he feels is intermittent, but simultaneously he feels constant pressure in his left chest. Associated symptoms include shortness of breath and feeling like he has no stamina. He often feels like the wind is knocked out of him. ? ?He confirms having 5 stents and 2 bypasses. ? ?In 2020 his LDL was 81. ? ?He denies any palpitations, or peripheral edema. No lightheadedness, headaches, syncope, orthopnea, or PND. ? ? ? ?Past Medical  History:  ?Diagnosis Date  ? Coronary artery disease involving native coronary artery with angina pectoris ([place]) 04/02/2014  ? LIMA to LAD and free radial to diagonal #1 20075 // s/p PCI of RCA with DES May 2011 and DES to RCA 05/18/14 // MV 11/13/14: Low risk with significant artifacts but no ischemia.  EF 53 // LHC 3/16: pLAD 95, 80; LCx irregs; pRCA stents ok, mRCA 40-50, L-LAD ok, free Rad-Dx ok, EF 45-50 // MV 4/17: ant-sept, apical defect, partially reversible inf-lat defect, EF 51 (no change from prior) >> Med Rx.   ? Essential hypertension 04/02/2014  ? History of echocardiogram   ? Echo 4/18: normal LV systolic function (EF 56), no RWMA  ? Hyperlipidemia 04/02/2014  ? LBBB (left bundle branch block) 04/02/2014  ? OSA (obstructive sleep apnea) 04/11/2018  ? ?Past Surgical History:  ?Procedure Laterality Date  ? LEFT HEART CATHETERIZATION WITH CORONARY/GRAFT ANGIOGRAM N/A 11/13/2011  ? Procedure: LEFT HEART CATHETERIZATION WITH Beatrix Fetters;  Surgeon: Sinclair Grooms, MD;  Location: [facility] CATH LAB;  Service: Cardiovascular;  Laterality: N/A;  ? LEFT HEART CATHETERIZATION WITH CORONARY/GRAFT ANGIOGRAM N/A 05/18/2014  ? Procedure: LEFT HEART CATHETERIZATION WITH Beatrix Fetters;  Surgeon: Sinclair Grooms, MD;  Location: [facility] C[facility] LAB;  Service: Cardiovascular;  Laterality: N/A;  ? LEFT HEART CATHETERIZATION WITH CORONARY/GRAFT ANGIOGRAM N/A 11/30/2014  ? Procedure: LEFT HEART CATHETERIZATION WITH Beatrix Fetters;  Surgeon: Belva Crome, MD;  Location: [facility] CATH LAB;  Service: Cardiovascular;  Laterality: N/A;  ?  ? ?Current Meds  ?Medication Sig  ? aspirin 81 MG  tablet Take 81 mg by mouth daily.  ? clopidogrel (PLAVIX) 75 MG tablet TAKE 1 TABLET BY MOUTH EVERY DAY  ? ezetimibe (ZETIA) 10 MG tablet Take 1 tablet (10 mg total) by mouth daily.  ? levothyroxine (SYNTHROID, LEVOTHROID) 150 MCG tablet Take 150 mcg by mouth daily before breakfast.  ? nebivolol (BYSTOLIC) 5 MG tablet Take 1 qd (uses  days that works, uses 3-4 days a week)  ? rosuvastatin (CRESTOR) 20 MG tablet Take 1 tablet (20 mg total) by mouth daily.  ?  ? ?Allergies:   Vistaril [hydroxyzine hcl] and Zocor [simvastatin]  ? ?Social History  ? ?Tobacco Use  ? Smoking status: Former  ? Smokeless tobacco: Never  ?Vaping Use  ? Vaping Use: Never used  ?Substance Use Topics  ? Alcohol use: Yes  ? Drug use: No  ?  ? ?Family Hx: ?The patient's family history includes Breast cancer in his mother; CAD in his father; Healthy in his brother, brother, and brother; Heart disease in his mother. ? ?ROS:   ?Please see the history of present illness.    ?(+) Chest pain ?(+) Chest pressure ?(+) Shortness of breath ?(+) Fatigue ?All other systems reviewed and are negative. ? ? ?Prior CV studies:   ?The following studies were reviewed today: ? ?Right LE Venous Doppler 07/20/2018: ?Summary:  ?Right: Abnormal reflux times were noted in the great saphenous vein at the  ?mid thigh, great saphenous vein at the knee, and great saphenous vein at  ?the prox calf. No DVT from the common femoral vein to the popliteal vein.  ?The patient complains of pain at  ? area of dilitation at the gastrocnemius veins, that appear somewhat  ?dilated.  ? ?Nuclear Stress Test 05/11/2017: ?Nuclear stress EF: 50%. ?There was no ST segment deviation noted during stress. ?No T wave inversion was noted during stress. ?Defect 1: There is a large defect of moderate severity present in the basal anteroseptal, mid anterior, mid anteroseptal, apical septal and apex location. ?Findings consistent with prior myocardial infarction. ?This is an intermediate risk study. ?The left ventricular ejection fraction is mildly decreased (45-54%). ?Defect 2: There is a medium defect of moderate severity present in the basal inferoseptal, basal inferior, mid inferoseptal and mid inferior location. ? ?Echo 12/14/16 ?Study Conclusions  ? ?- Left ventricle: The cavity size was normal. Systolic function was  ?  normal.  Wall motion was normal; there were no regional wall  ?  motion abnormalities.  ?- Ventricular septum: Septal motion showed paradox. These changes  ?  are consistent with a left bundle branch block.  ?- Atrial septum: No defect or patent foramen ovale was identified.  ?  ?Myoview 12/10/15 ?Low risk stress nuclear study with large, severe, fixed anteroseptal/apical defect and large, severe, partially reversible inferior lateral defect; findings consistent with probable LBBB artifact vs prior anteroseptal/apical infarct and prior inferior lateral infarct with mild peri-infarct ischemia; EF 51 with mild global hypokinesis and mild LVE. ?  ?LHC 11/30/14 ?LM patent ?LAD prox 95% before stent, 80% after stent ?LCx irregularities (nothing > 50%) ?RCA prox to mid stent ok, mid 40-50% ?L-LAD patent ?Free Radial-Dx patent ?EF 45-50% ?  ?Myoview 11/13/14 ?Low risk stress nuclear study with significant artifacts but no ischemia.  LV Ejection Fraction: 53%.  LV Wall Motion:  Paradoxical septal motion. ?  ?  ? ?Labs/Other Tests and Data Reviewed:   ? ?EKG:   EKG is personally reviewed. ?12/09/2021: Sinus bradycardia. Rate 53 bpm. Poor R wave  progression. ?01/05/2020: SB 49 LBBB ? ?Recent Labs: ?No results found for requested labs within last 8760 hours.  ? ?Recent Lipid Panel ?Lab Results  ?Component Value Date/Time  ? CHOL 116 12/22/2016 12:00 AM  ? TRIG 117 12/22/2016 12:00 AM  ? HDL 46 12/22/2016 12:00 AM  ? CHOLHDL 2.5 12/22/2016 12:00 AM  ? CHOLHDL 2.4 04/03/2016 12:30 PM  ? LDLCALC 47 12/22/2016 12:00 AM  ? LDLDIRECT 58 12/22/2016 12:00 AM  ? ? ?Wt Readings from Last 3 Encounters:  ?12/09/21 184 lb (83.5 kg)  ?05/19/21 187 lb 9.6 oz (85.1 kg)  ?10/03/20 178 lb (80.7 kg)  ?  ? ?Risk Assessment/Calculations:   ? ? ?Objective:   ? ?VS:  BP 106/62   Pulse (!) 53   Ht '5\' 9"'$  (1.753 m)   Wt 184 lb (83.5 kg)   SpO2 96%   BMI 27.17 kg/m?    ? ?Wt Readings from Last 3 Encounters:  ?12/09/21 184 lb (83.5 kg)  ?05/19/21 187 lb 9.6 oz (85.1  kg)  ?10/03/20 178 lb (80.7 kg)  ?  ?GEN: Well nourished, well developed in no acute distress ?HEENT: Normal ?NECK: No JVD; No carotid bruits ?LYMPHATICS: No lymphadenopathy ?CARDIAC: RRR, no murmurs, rubs, gallop

## 2021-12-09 NOTE — Progress Notes (Signed)
? ?Cardiology Office Note  ? ?Date:  12/09/2021  ? ?ID:  Mark Antigua, MD, DOB 09/20/1955, MRN [id number] ? ?PCP:  [name], MD  ?Cardiologist:  Candee Furbish, MD  ?Electrophysiologist:  None  ? ?Evaluation Performed:  Follow-Up Visit ? ?Chief Complaint: Angina ? ?History of Present Illness:   ? 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 ? 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 ? ?I had seen him previously on 01/05/2020 for preoperative evaluation.. ? ?He underwent CABG in 2005 and subsequent PCI to the RCA x 2 with a DES (2011 and 2015).  LHC in 3/16 demonstrated patent bypass grafts and patent RCA stents. Nuclear stress test in 4/17 demonstrated anteroseptal, apical, inferior lateral defect consistent with LBBB artifact versus prior anteroseptal/apical infarct and prior inferior lateral infarct with mild peri-infarct ischemia, EF 51. It was felt that his study demonstrated similar findings to his prior evaluations. Medical therapy was continued ? ?At his last appointment, he reported an episode of chest pressure lasting a few hours duration, nonexertional, NTG spray. Went away gradually.  Has had occasional dyspepsia.  ? ?Today: ?Yesterday and today, he is feeling a lot better overall. However, since last Wednesday or Thursday, he is suffering from random, intermittent chest pain. This was worse last weekend. He notes that the pain he feels is intermittent, but simultaneously he feels constant pressure in his left chest. Associated symptoms include shortness of breath and feeling like he has no stamina. He often feels like the wind is knocked out of him. ? ?He confirms having 5 stents and 2 bypasses. ? ?In 2020 his LDL was 81. ? ?He denies any palpitations, or peripheral edema. No lightheadedness, headaches, syncope, orthopnea, or PND. ? ? ? ?Past Medical  History:  ?Diagnosis Date  ? Coronary artery disease involving native coronary artery with angina pectoris ([place]) 04/02/2014  ? LIMA to LAD and free radial to diagonal #1 20075 // s/p PCI of RCA with DES May 2011 and DES to RCA 05/18/14 // MV 11/13/14: Low risk with significant artifacts but no ischemia.  EF 53 // LHC 3/16: pLAD 95, 80; LCx irregs; pRCA stents ok, mRCA 40-50, L-LAD ok, free Rad-Dx ok, EF 45-50 // MV 4/17: ant-sept, apical defect, partially reversible inf-lat defect, EF 51 (no change from prior) >> Med Rx.   ? Essential hypertension 04/02/2014  ? History of echocardiogram   ? Echo 4/18: normal LV systolic function (EF 56), no RWMA  ? Hyperlipidemia 04/02/2014  ? LBBB (left bundle branch block) 04/02/2014  ? OSA (obstructive sleep apnea) 04/11/2018  ? ?Past Surgical History:  ?Procedure Laterality Date  ? LEFT HEART CATHETERIZATION WITH CORONARY/GRAFT ANGIOGRAM N/A 11/13/2011  ? Procedure: LEFT HEART CATHETERIZATION WITH Beatrix Fetters;  Surgeon: Sinclair Grooms, MD;  Location: [facility] CATH LAB;  Service: Cardiovascular;  Laterality: N/A;  ? LEFT HEART CATHETERIZATION WITH CORONARY/GRAFT ANGIOGRAM N/A 05/18/2014  ? Procedure: LEFT HEART CATHETERIZATION WITH Beatrix Fetters;  Surgeon: Sinclair Grooms, MD;  Location: [facility] CATH LAB;  Service: Cardiovascular;  Laterality: N/A;  ? LEFT HEART CATHETERIZATION WITH CORONARY/GRAFT ANGIOGRAM N/A 11/30/2014  ? Procedure: LEFT HEART CATHETERIZATION WITH Beatrix Fetters;  Surgeon: Belva Crome, MD;  Location: [facility] CATH LAB;  Service: Cardiovascular;  Laterality: N/A;  ?  ? ?Current Meds  ?Medication Sig  ? aspirin 81 MG  tablet Take 81 mg by mouth daily.  ? clopidogrel (PLAVIX) 75 MG tablet TAKE 1 TABLET BY MOUTH EVERY DAY  ? ezetimibe (ZETIA) 10 MG tablet Take 1 tablet (10 mg total) by mouth daily.  ? levothyroxine (SYNTHROID, LEVOTHROID) 150 MCG tablet Take 150 mcg by mouth daily before breakfast.  ? nebivolol (BYSTOLIC) 5 MG tablet Take 1 qd (uses  days that works, uses 3-4 days a week)  ? rosuvastatin (CRESTOR) 20 MG tablet Take 1 tablet (20 mg total) by mouth daily.  ?  ? ?Allergies:   Vistaril [hydroxyzine hcl] and Zocor [simvastatin]  ? ?Social History  ? ?Tobacco Use  ? Smoking status: Former  ? Smokeless tobacco: Never  ?Vaping Use  ? Vaping Use: Never used  ?Substance Use Topics  ? Alcohol use: Yes  ? Drug use: No  ?  ? ?Family Hx: ?The patient's family history includes Breast cancer in his mother; CAD in his father; Healthy in his brother, brother, and brother; Heart disease in his mother. ? ?ROS:   ?Please see the history of present illness.    ?(+) Chest pain ?(+) Chest pressure ?(+) Shortness of breath ?(+) Fatigue ?All other systems reviewed and are negative. ? ? ?Prior CV studies:   ?The following studies were reviewed today: ? ?Right LE Venous Doppler 07/20/2018: ?Summary:  ?Right: Abnormal reflux times were noted in the great saphenous vein at the  ?mid thigh, great saphenous vein at the knee, and great saphenous vein at  ?the prox calf. No DVT from the common femoral vein to the popliteal vein.  ?The patient complains of pain at  ? area of dilitation at the gastrocnemius veins, that appear somewhat  ?dilated.  ? ?Nuclear Stress Test 05/11/2017: ?Nuclear stress EF: 50%. ?There was no ST segment deviation noted during stress. ?No T wave inversion was noted during stress. ?Defect 1: There is a large defect of moderate severity present in the basal anteroseptal, mid anterior, mid anteroseptal, apical septal and apex location. ?Findings consistent with prior myocardial infarction. ?This is an intermediate risk study. ?The left ventricular ejection fraction is mildly decreased (45-54%). ?Defect 2: There is a medium defect of moderate severity present in the basal inferoseptal, basal inferior, mid inferoseptal and mid inferior location. ? ?Echo 12/14/16 ?Study Conclusions  ? ?- Left ventricle: The cavity size was normal. Systolic function was  ?  normal.  Wall motion was normal; there were no regional wall  ?  motion abnormalities.  ?- Ventricular septum: Septal motion showed paradox. These changes  ?  are consistent with a left bundle branch block.  ?- Atrial septum: No defect or patent foramen ovale was identified.  ?  ?Myoview 12/10/15 ?Low risk stress nuclear study with large, severe, fixed anteroseptal/apical defect and large, severe, partially reversible inferior lateral defect; findings consistent with probable LBBB artifact vs prior anteroseptal/apical infarct and prior inferior lateral infarct with mild peri-infarct ischemia; EF 51 with mild global hypokinesis and mild LVE. ?  ?LHC 11/30/14 ?LM patent ?LAD prox 95% before stent, 80% after stent ?LCx irregularities (nothing > 50%) ?RCA prox to mid stent ok, mid 40-50% ?L-LAD patent ?Free Radial-Dx patent ?EF 45-50% ?  ?Myoview 11/13/14 ?Low risk stress nuclear study with significant artifacts but no ischemia.  LV Ejection Fraction: 53%.  LV Wall Motion:  Paradoxical septal motion. ?  ?  ? ?Labs/Other Tests and Data Reviewed:   ? ?EKG:   EKG is personally reviewed. ?12/09/2021: Sinus bradycardia. Rate 53 bpm. Poor R wave  progression. ?01/05/2020: SB 49 LBBB ? ?Recent Labs: ?No results found for requested labs within last 8760 hours.  ? ?Recent Lipid Panel ?Lab Results  ?Component Value Date/Time  ? CHOL 116 12/22/2016 12:00 AM  ? TRIG 117 12/22/2016 12:00 AM  ? HDL 46 12/22/2016 12:00 AM  ? CHOLHDL 2.5 12/22/2016 12:00 AM  ? CHOLHDL 2.4 04/03/2016 12:30 PM  ? LDLCALC 47 12/22/2016 12:00 AM  ? LDLDIRECT 58 12/22/2016 12:00 AM  ? ? ?Wt Readings from Last 3 Encounters:  ?12/09/21 184 lb (83.5 kg)  ?05/19/21 187 lb 9.6 oz (85.1 kg)  ?10/03/20 178 lb (80.7 kg)  ?  ? ?Risk Assessment/Calculations:   ? ? ?Objective:   ? ?VS:  BP 106/62   Pulse (!) 53   Ht '5\' 9"'$  (1.753 m)   Wt 184 lb (83.5 kg)   SpO2 96%   BMI 27.17 kg/m?    ? ?Wt Readings from Last 3 Encounters:  ?12/09/21 184 lb (83.5 kg)  ?05/19/21 187 lb 9.6 oz (85.1  kg)  ?10/03/20 178 lb (80.7 kg)  ?  ?GEN: Well nourished, well developed in no acute distress ?HEENT: Normal ?NECK: No JVD; No carotid bruits ?LYMPHATICS: No lymphadenopathy ?CARDIAC: RRR, no murmurs, rubs, gallop

## 2021-12-09 NOTE — Assessment & Plan Note (Signed)
Followed at Dr. Hazeline Junker office.  Previous myeloproliferative panel in April 2021 did not show any genetic mutation.  No further work-up necessary.  Hematology note reviewed. ?

## 2021-12-09 NOTE — Assessment & Plan Note (Signed)
Stable, no high risk symptoms such as syncope. ?

## 2021-12-09 NOTE — Patient Instructions (Addendum)
Medication Instructions:  ?The current medical regimen is effective;  continue present plan and medications. ? ?*If you need a refill on your cardiac medications before your next appointment, please call your pharmacy* ? ? ?Lab Work: ?Please have blood work [facility]) on Tuesday 12/16/2021. ? ?If you have labs (blood work) drawn today and your tests are completely normal, you will receive your results only by: ?MyChart Message (if you have MyChart) OR ?A paper copy in the mail ?If you have any lab test that is abnormal or we need to change your treatment, we will call you to review the results. ? ? ?Testing/Procedures: ? ? ?[facility] OFFICE ?[address], [address] ?[place] [address] ?Dept: [phone] ?Loc: [phone] ? ?Heriberto Antigua, MD  12/09/2021 ? ?You are scheduled for a Cardiac Catheterization on Friday, April 14 with Dr. Daneen Schick ? ?1. Please arrive at the [facility] (Main Entrance A) at [facility]: [address] [place], [place] [address] at 5:30 AM (two hours before your procedure to ensure your preparation). Free valet parking service is available.  ? ?Special note: Every effort is made to have your procedure done on time. Please understand that emergencies sometimes delay scheduled procedures. ? ?2. Diet: Do not eat or drink anything after midnight prior to your procedure except sips of water to take medications. ? ?3. Labs: CBC, BMP 12/16/2021 ? ?4. Medication instructions in preparation for your procedure: ? ?On the morning of your procedure, take your Aspirin and Plavix/Clopidogrel and any morning medicines NOT listed above.  You may use sips of water. ? ?5. Plan for one night stay--bring personal belongings. ?6. Bring a current list of your medications and current insurance cards. ?7. You MUST have a responsible person to drive you home. ?8. Someone MUST be with you the first 24 hours after you arrive  home or your discharge will be delayed. ?9. Please wear clothes that are easy to get on and off and wear slip-on shoes. ? ?Thank you for allowing [place] to care for you! ?  --  Invasive Cardiovascular services ? ?Your physician has requested that you have an echocardiogram. Echocardiography is a painless test that uses sound waves to create images of your heart. It provides your doctor with information about the size and shape of your heart and how well your heart?s chambers and valves are working. This procedure takes approximately one hour. There are no restrictions for this procedure. ? ?Follow-Up: ?At [facility], you and your health needs are our priority.  As part of our continuing mission to provide you with exceptional heart care, we have created designated Provider Care Teams.  These Care Teams include your primary Cardiologist (physician) and Advanced Practice Providers (APPs -  Physician Assistants and Nurse Practitioners) who all work together to provide you with the care you need, when you need it. ? ?We recommend signing up for the patient portal called "MyChart".  Sign up information is provided on this After Visit Summary.  MyChart is used to connect with patients for Virtual Visits (Telemedicine).  Patients are able to view lab/test results, encounter notes, upcoming appointments, etc.  Non-urgent messages can be sent to your provider as well.   ?To learn more about what you can do with MyChart, go to [email or web address].   ? ?Your next appointment:   ?To be determined ? ?The format for your next appointment:   ?In person ? ?Provider:   ?  Candee Furbish, MD   ? ? ?Thank you for choosing [facility]!! ? ? ? ?

## 2021-12-09 NOTE — Assessment & Plan Note (Signed)
Followed by Dr. Halford Chessman.  CPAP. ?

## 2021-12-09 NOTE — Addendum Note (Signed)
Addended by: Jerline Pain on: 12/09/2021 05:13 PM ? ? Modules accepted: Orders ? ?

## 2021-12-09 NOTE — Assessment & Plan Note (Addendum)
CABG 2005 LIMA to LAD free radial to diagonal/ramus.  PCI to RCA 2011.  PCI to RCA 2015.  He is having increasing symptoms concerning for angina.  We will proceed with cardiac catheterization.  Risks and benefits of been explained including stroke heart attack death renal impairment bleeding.  Willing to proceed.  Continuing with aspirin and Plavix Zetia Crestor Bystolic.  We will check an echocardiogram to ensure proper structure and function.  Has been more short of breath recently.  Decreased stamina.  Avid swimmer.  Tries to work out.  Father with severe coronary artery disease. ?

## 2021-12-09 NOTE — Addendum Note (Signed)
Addended by: Shellia Cleverly on: 12/09/2021 04:12 PM ? ? Modules accepted: Orders ? ?

## 2021-12-16 ENCOUNTER — Other Ambulatory Visit: Payer: BC Managed Care – PPO

## 2021-12-16 ENCOUNTER — Ambulatory Visit (HOSPITAL_COMMUNITY): Payer: BC Managed Care – PPO | Attending: Cardiology

## 2021-12-16 DIAGNOSIS — I25119 Atherosclerotic heart disease of native coronary artery with unspecified angina pectoris: Secondary | ICD-10-CM | POA: Insufficient documentation

## 2021-12-16 DIAGNOSIS — R0602 Shortness of breath: Secondary | ICD-10-CM | POA: Diagnosis not present

## 2021-12-16 DIAGNOSIS — Z01810 Encounter for preprocedural cardiovascular examination: Secondary | ICD-10-CM

## 2021-12-16 LAB — CBC
Hematocrit: 45.1 % (ref 37.5–51.0)
Hemoglobin: 15.8 g/dL (ref 13.0–17.7)
MCH: 33.2 pg — ABNORMAL HIGH (ref 26.6–33.0)
MCHC: 35 g/dL (ref 31.5–35.7)
MCV: 95 fL (ref 79–97)
Platelets: 210 10*3/uL (ref 150–450)
RBC: 4.76 x10E6/uL (ref 4.14–5.80)
RDW: 13.5 % (ref 11.6–15.4)
WBC: 7.7 10*3/uL (ref 3.4–10.8)

## 2021-12-16 LAB — BASIC METABOLIC PANEL
BUN/Creatinine Ratio: 11 (ref 10–24)
BUN: 12 mg/dL (ref 8–27)
CO2: 30 mmol/L — ABNORMAL HIGH (ref 20–29)
Calcium: 10.1 mg/dL (ref 8.6–10.2)
Chloride: 104 mmol/L (ref 96–106)
Creatinine, Ser: 1.14 mg/dL (ref 0.76–1.27)
Glucose: 93 mg/dL (ref 70–99)
Potassium: 4.5 mmol/L (ref 3.5–5.2)
Sodium: 141 mmol/L (ref 134–144)
eGFR: 72 mL/min/{1.73_m2} (ref >59)

## 2021-12-16 LAB — ECHOCARDIOGRAM COMPLETE
Area-P 1/2: 2.72 cm2
P 1/2 time: 503 msec
S' Lateral: 3.1 cm

## 2021-12-17 ENCOUNTER — Encounter: Payer: Self-pay | Admitting: *Deleted

## 2021-12-19 ENCOUNTER — Other Ambulatory Visit: Payer: Self-pay

## 2021-12-19 ENCOUNTER — Encounter (HOSPITAL_COMMUNITY): Payer: Self-pay | Admitting: Interventional Cardiology

## 2021-12-19 ENCOUNTER — Ambulatory Visit (HOSPITAL_COMMUNITY)
Admission: RE | Admit: 2021-12-19 | Discharge: 2021-12-19 | Disposition: A | Discharge status: Home / Self Care | Payer: BC Managed Care – PPO | Attending: Interventional Cardiology | Admitting: Interventional Cardiology

## 2021-12-19 ENCOUNTER — Encounter (HOSPITAL_COMMUNITY)
Admission: RE | Disposition: A | Discharge status: Home / Self Care | Payer: Self-pay | Source: Home / Self Care | Attending: Interventional Cardiology

## 2021-12-19 DIAGNOSIS — G4733 Obstructive sleep apnea (adult) (pediatric): Secondary | ICD-10-CM | POA: Diagnosis not present

## 2021-12-19 DIAGNOSIS — Z955 Presence of coronary angioplasty implant and graft: Secondary | ICD-10-CM | POA: Insufficient documentation

## 2021-12-19 DIAGNOSIS — E785 Hyperlipidemia, unspecified: Secondary | ICD-10-CM | POA: Diagnosis present

## 2021-12-19 DIAGNOSIS — D45 Polycythemia vera: Secondary | ICD-10-CM | POA: Diagnosis not present

## 2021-12-19 DIAGNOSIS — I10 Essential (primary) hypertension: Secondary | ICD-10-CM | POA: Diagnosis present

## 2021-12-19 DIAGNOSIS — Z8249 Family history of ischemic heart disease and other diseases of the circulatory system: Secondary | ICD-10-CM | POA: Diagnosis not present

## 2021-12-19 DIAGNOSIS — Z7982 Long term (current) use of aspirin: Secondary | ICD-10-CM | POA: Diagnosis not present

## 2021-12-19 DIAGNOSIS — Z951 Presence of aortocoronary bypass graft: Secondary | ICD-10-CM | POA: Diagnosis not present

## 2021-12-19 DIAGNOSIS — Z7902 Long term (current) use of antithrombotics/antiplatelets: Secondary | ICD-10-CM | POA: Diagnosis not present

## 2021-12-19 DIAGNOSIS — I2510 Atherosclerotic heart disease of native coronary artery without angina pectoris: Secondary | ICD-10-CM

## 2021-12-19 DIAGNOSIS — Z79899 Other long term (current) drug therapy: Secondary | ICD-10-CM | POA: Insufficient documentation

## 2021-12-19 DIAGNOSIS — I447 Left bundle-branch block, unspecified: Secondary | ICD-10-CM

## 2021-12-19 DIAGNOSIS — R079 Chest pain, unspecified: Secondary | ICD-10-CM

## 2021-12-19 DIAGNOSIS — I25119 Atherosclerotic heart disease of native coronary artery with unspecified angina pectoris: Secondary | ICD-10-CM

## 2021-12-19 DIAGNOSIS — R0602 Shortness of breath: Secondary | ICD-10-CM

## 2021-12-19 DIAGNOSIS — E7800 Pure hypercholesterolemia, unspecified: Secondary | ICD-10-CM

## 2021-12-19 DIAGNOSIS — Z01810 Encounter for preprocedural cardiovascular examination: Secondary | ICD-10-CM

## 2021-12-19 HISTORY — PX: LEFT HEART CATH AND CORS/GRAFTS ANGIOGRAPHY: CATH118250

## 2021-12-19 SURGERY — LEFT HEART CATH AND CORS/GRAFTS ANGIOGRAPHY
Anesthesia: LOCAL

## 2021-12-19 MED ORDER — SODIUM CHLORIDE 0.9 % IV SOLN: 250.0000 mL | INTRAVENOUS | Status: DC | PRN

## 2021-12-19 MED ORDER — IOHEXOL 350 MG/ML SOLN
INTRAVENOUS | Status: DC | PRN
  Administered 2021-12-19: 90 mL

## 2021-12-19 MED ORDER — HEPARIN (PORCINE) IN NACL 1000-0.9 UT/500ML-% IV SOLN
INTRAVENOUS | Status: DC | PRN
  Administered 2021-12-19 (×2): 500 mL

## 2021-12-19 MED ORDER — ACETAMINOPHEN 325 MG PO TABS: 650.0000 mg | ORAL_TABLET | ORAL | Status: DC | PRN

## 2021-12-19 MED ORDER — SODIUM CHLORIDE 0.9 % IV SOLN: INTRAVENOUS | Status: AC

## 2021-12-19 MED ORDER — SODIUM CHLORIDE 0.9 % WEIGHT BASED INFUSION
3.0000 mL/kg/h | INTRAVENOUS | Status: AC
  Administered 2021-12-19: 3 mL/kg/h via INTRAVENOUS

## 2021-12-19 MED ORDER — HEPARIN SODIUM (PORCINE) 1000 UNIT/ML IJ SOLN
INTRAMUSCULAR | Status: AC
  Filled 2021-12-19: qty 10

## 2021-12-19 MED ORDER — HEPARIN (PORCINE) IN NACL 1000-0.9 UT/500ML-% IV SOLN
INTRAVENOUS | Status: AC
Start: 2021-12-19 — End: ?
  Filled 2021-12-19: qty 500

## 2021-12-19 MED ORDER — SODIUM CHLORIDE 0.9% FLUSH: 3.0000 mL | INTRAVENOUS | Status: DC | PRN

## 2021-12-19 MED ORDER — SODIUM CHLORIDE 0.9% FLUSH: 3.0000 mL | Freq: Two times a day (BID) | INTRAVENOUS | Status: DC

## 2021-12-19 MED ORDER — ASPIRIN 81 MG PO CHEW: 81.0000 mg | CHEWABLE_TABLET | ORAL | Status: DC

## 2021-12-19 MED ORDER — HYDRALAZINE HCL 20 MG/ML IJ SOLN: 10.0000 mg | INTRAMUSCULAR | Status: DC | PRN

## 2021-12-19 MED ORDER — FENTANYL CITRATE (PF) 100 MCG/2ML IJ SOLN
INTRAMUSCULAR | Status: DC | PRN
Start: 2021-12-19 — End: 2021-12-19
  Administered 2021-12-19: 25 ug via INTRAVENOUS

## 2021-12-19 MED ORDER — MIDAZOLAM HCL 2 MG/2ML IJ SOLN
INTRAMUSCULAR | Status: DC | PRN
Start: 2021-12-19 — End: 2021-12-19
  Administered 2021-12-19 (×2): 1 mg via INTRAVENOUS

## 2021-12-19 MED ORDER — ONDANSETRON HCL 4 MG/2ML IJ SOLN: 4.0000 mg | Freq: Four times a day (QID) | INTRAMUSCULAR | Status: DC | PRN

## 2021-12-19 MED ORDER — LABETALOL HCL 5 MG/ML IV SOLN: 10.0000 mg | INTRAVENOUS | Status: DC | PRN

## 2021-12-19 MED ORDER — FENTANYL CITRATE (PF) 100 MCG/2ML IJ SOLN
INTRAMUSCULAR | Status: AC
  Filled 2021-12-19: qty 2

## 2021-12-19 MED ORDER — LIDOCAINE HCL (PF) 1 % IJ SOLN
INTRAMUSCULAR | Status: DC | PRN
  Administered 2021-12-19: 15 mL

## 2021-12-19 MED ORDER — LIDOCAINE HCL (PF) 1 % IJ SOLN
INTRAMUSCULAR | Status: AC
  Filled 2021-12-19: qty 30

## 2021-12-19 MED ORDER — OXYCODONE HCL 5 MG PO TABS: 5.0000 mg | ORAL_TABLET | ORAL | Status: DC | PRN

## 2021-12-19 MED ORDER — SODIUM CHLORIDE 0.9 % WEIGHT BASED INFUSION: 1.0000 mL/kg/h | INTRAVENOUS | Status: DC

## 2021-12-19 MED ORDER — ASPIRIN 81 MG PO CHEW: 81.0000 mg | CHEWABLE_TABLET | Freq: Every day | ORAL | Status: DC

## 2021-12-19 MED ORDER — MIDAZOLAM HCL 2 MG/2ML IJ SOLN
INTRAMUSCULAR | Status: AC
  Filled 2021-12-19: qty 2

## 2021-12-19 SURGICAL SUPPLY — 10 items
CATH INFINITI 5 FR IM (CATHETERS) ×1 IMPLANT
CATH INFINITI 5FR JL4 (CATHETERS) ×1 IMPLANT
CATH INFINITI 5FR MPB2 (CATHETERS) ×1 IMPLANT
KIT HEART LEFT (KITS) ×2 IMPLANT
PACK CARDIAC CATHETERIZATION (CUSTOM PROCEDURE TRAY) ×2 IMPLANT
SHEATH PINNACLE 5F 10CM (SHEATH) ×1 IMPLANT
SHEATH PROBE COVER 6X72 (BAG) ×1 IMPLANT
TRANSDUCER W/STOPCOCK (MISCELLANEOUS) ×2 IMPLANT
TUBING CIL FLEX 10 FLL-RA (TUBING) ×2 IMPLANT
WIRE EMERALD 3MM-J .035X150CM (WIRE) ×1 IMPLANT

## 2021-12-19 NOTE — CV Procedure (Signed)
Patent LIMA to LAD ?Patent free radial to ramus intermedius ?Luminal irregularities in proximal and mid circumflex, nonobstructive ?Distal 25% left main ?99 followed by 90% stenoses at the margins of a previously placed stent in LAD proximal segment, unchanged from 2016.  There is competitive retrograde flow from the LIMA to mid LAD. ?Heavily stented dominant right coronary is widely patent.  Between a very distal stent and the stent in the mid right coronary, there is a native segment with nonobstructive disease up to 50% stenosis, eccentric. ?Mid anterior wall moderate hypokinesis.  EF 50%.  EDP 5 mmHg. ? ?Angiography is unchanged compared to 2016.  In some areas there is some improvement in nonobstructive disease.  No areas of worsening. ? ?Current symptoms do not reflect myocardial ischemia and could be musculoskeletal or some other noncardiac etiology. ?

## 2021-12-19 NOTE — Progress Notes (Addendum)
SITE AREA: right groin/femoral ? ?SITE PRIOR TO REMOVAL:  LEVEL 0 ? ?PRESSURE APPLIED FOR: approximately 20 minutes ? ?MANUAL: yes ? ?PATIENT STATUS DURING PULL: stable ? ?POST PULL SITE:  LEVEL 0 ? ?POST PULL INSTRUCTIONS GIVEN: yes ? ?POST PULL PULSES PRESENT: bilateral pedal pulses at +2 ? ?DRESSING APPLIED: gauze with tegaderm ? ?BEDREST BEGINS @ 0910, X4 hours ? ?COMMENTS:  sheath removed by Drema Pry, RN, manual pressure hold taken over by Suella Broad, RN ?

## 2021-12-19 NOTE — Progress Notes (Signed)
Patient and wife was given discharge instructions. Both verbalized understanding. 

## 2021-12-19 NOTE — Interval H&P Note (Signed)
Cath Lab Visit (complete for each Cath Lab visit) ? ?Clinical Evaluation Leading to the Procedure:  ? ?ACS: No. ? ?Non-ACS:   ? ?Anginal Classification: CCS II ? ?Anti-ischemic medical therapy: Minimal Therapy (1 class of medications) ? ?Non-Invasive Test Results: No non-invasive testing performed ? ?Prior CABG: Previous CABG ? ? ? ? ? ?History and Physical Interval Note: ? ?12/19/2021 ?7:23 AM ? ?Mark Antigua, MD  has presented today for surgery, with the diagnosis of chest pain - cad.  The various methods of treatment have been discussed with the patient and family. After consideration of risks, benefits and other options for treatment, the patient has consented to  Procedure(s): ?LEFT HEART CATH AND CORS/GRAFTS ANGIOGRAPHY (N/A) as a surgical intervention.  The patient's history has been reviewed, patient examined, no change in status, stable for surgery.  I have reviewed the patient's chart and labs.  Questions were answered to the patient's satisfaction.   ? ? ?Mark Payne ? ? ?

## 2021-12-19 NOTE — Progress Notes (Signed)
Took over patient care at 1240. ?

## 2022-08-28 ENCOUNTER — Other Ambulatory Visit (HOSPITAL_COMMUNITY): Payer: Self-pay

## 2022-12-17 DIAGNOSIS — E039 Hypothyroidism, unspecified: Secondary | ICD-10-CM | POA: Diagnosis not present

## 2022-12-17 DIAGNOSIS — E785 Hyperlipidemia, unspecified: Secondary | ICD-10-CM | POA: Diagnosis not present

## 2022-12-17 DIAGNOSIS — Z1212 Encounter for screening for malignant neoplasm of rectum: Secondary | ICD-10-CM | POA: Diagnosis not present

## 2022-12-18 DIAGNOSIS — Z0000 Encounter for general adult medical examination without abnormal findings: Secondary | ICD-10-CM | POA: Diagnosis not present

## 2022-12-18 DIAGNOSIS — Z1339 Encounter for screening examination for other mental health and behavioral disorders: Secondary | ICD-10-CM | POA: Diagnosis not present

## 2022-12-18 DIAGNOSIS — E785 Hyperlipidemia, unspecified: Secondary | ICD-10-CM | POA: Diagnosis not present

## 2022-12-18 DIAGNOSIS — R82998 Other abnormal findings in urine: Secondary | ICD-10-CM | POA: Diagnosis not present

## 2022-12-18 DIAGNOSIS — G4733 Obstructive sleep apnea (adult) (pediatric): Secondary | ICD-10-CM | POA: Diagnosis not present

## 2022-12-18 DIAGNOSIS — I10 Essential (primary) hypertension: Secondary | ICD-10-CM | POA: Diagnosis not present

## 2022-12-18 DIAGNOSIS — Z23 Encounter for immunization: Secondary | ICD-10-CM | POA: Diagnosis not present

## 2022-12-18 DIAGNOSIS — I25810 Atherosclerosis of coronary artery bypass graft(s) without angina pectoris: Secondary | ICD-10-CM | POA: Diagnosis not present

## 2022-12-18 DIAGNOSIS — Z1331 Encounter for screening for depression: Secondary | ICD-10-CM | POA: Diagnosis not present

## 2023-01-04 DIAGNOSIS — E291 Testicular hypofunction: Secondary | ICD-10-CM | POA: Diagnosis not present

## 2023-02-16 DIAGNOSIS — Z0000 Encounter for general adult medical examination without abnormal findings: Secondary | ICD-10-CM | POA: Diagnosis not present

## 2023-02-16 DIAGNOSIS — E039 Hypothyroidism, unspecified: Secondary | ICD-10-CM | POA: Diagnosis not present

## 2023-02-19 ENCOUNTER — Other Ambulatory Visit (HOSPITAL_COMMUNITY): Payer: Self-pay

## 2023-02-19 MED ORDER — LEVOTHYROXINE SODIUM 125 MCG PO CAPS
125.0000 ug | ORAL_CAPSULE | Freq: Every morning | ORAL | 3 refills | Status: DC
  Filled 2023-02-19: qty 90, 90d supply, fill #0

## 2023-04-23 DIAGNOSIS — E039 Hypothyroidism, unspecified: Secondary | ICD-10-CM | POA: Diagnosis not present

## 2023-04-23 DIAGNOSIS — Z0000 Encounter for general adult medical examination without abnormal findings: Secondary | ICD-10-CM | POA: Diagnosis not present

## 2023-04-23 DIAGNOSIS — Z1389 Encounter for screening for other disorder: Secondary | ICD-10-CM | POA: Diagnosis not present

## 2023-04-23 DIAGNOSIS — E785 Hyperlipidemia, unspecified: Secondary | ICD-10-CM | POA: Diagnosis not present

## 2023-04-26 DIAGNOSIS — Z0000 Encounter for general adult medical examination without abnormal findings: Secondary | ICD-10-CM | POA: Diagnosis not present

## 2023-04-26 DIAGNOSIS — E291 Testicular hypofunction: Secondary | ICD-10-CM | POA: Diagnosis not present

## 2023-04-26 DIAGNOSIS — Z1389 Encounter for screening for other disorder: Secondary | ICD-10-CM | POA: Diagnosis not present

## 2023-05-14 DIAGNOSIS — E291 Testicular hypofunction: Secondary | ICD-10-CM | POA: Diagnosis not present

## 2023-09-11 DIAGNOSIS — H5711 Ocular pain, right eye: Secondary | ICD-10-CM | POA: Diagnosis not present

## 2023-10-19 DIAGNOSIS — E039 Hypothyroidism, unspecified: Secondary | ICD-10-CM | POA: Diagnosis not present

## 2023-10-19 DIAGNOSIS — E291 Testicular hypofunction: Secondary | ICD-10-CM | POA: Diagnosis not present

## 2023-10-19 DIAGNOSIS — E785 Hyperlipidemia, unspecified: Secondary | ICD-10-CM | POA: Diagnosis not present

## 2023-12-27 DIAGNOSIS — E039 Hypothyroidism, unspecified: Secondary | ICD-10-CM | POA: Diagnosis not present

## 2023-12-27 DIAGNOSIS — E291 Testicular hypofunction: Secondary | ICD-10-CM | POA: Diagnosis not present

## 2023-12-27 DIAGNOSIS — Z1212 Encounter for screening for malignant neoplasm of rectum: Secondary | ICD-10-CM | POA: Diagnosis not present

## 2023-12-30 DIAGNOSIS — E785 Hyperlipidemia, unspecified: Secondary | ICD-10-CM | POA: Diagnosis not present

## 2023-12-30 DIAGNOSIS — Z125 Encounter for screening for malignant neoplasm of prostate: Secondary | ICD-10-CM | POA: Diagnosis not present

## 2024-01-03 DIAGNOSIS — I10 Essential (primary) hypertension: Secondary | ICD-10-CM | POA: Diagnosis not present

## 2024-01-03 DIAGNOSIS — Z1339 Encounter for screening examination for other mental health and behavioral disorders: Secondary | ICD-10-CM | POA: Diagnosis not present

## 2024-01-03 DIAGNOSIS — Z0000 Encounter for general adult medical examination without abnormal findings: Secondary | ICD-10-CM | POA: Diagnosis not present

## 2024-01-03 DIAGNOSIS — E291 Testicular hypofunction: Secondary | ICD-10-CM | POA: Diagnosis not present

## 2024-01-03 DIAGNOSIS — R82998 Other abnormal findings in urine: Secondary | ICD-10-CM | POA: Diagnosis not present

## 2024-01-03 DIAGNOSIS — Z23 Encounter for immunization: Secondary | ICD-10-CM | POA: Diagnosis not present

## 2024-01-13 DIAGNOSIS — N390 Urinary tract infection, site not specified: Secondary | ICD-10-CM | POA: Diagnosis not present

## 2024-01-13 DIAGNOSIS — R3129 Other microscopic hematuria: Secondary | ICD-10-CM | POA: Diagnosis not present

## 2024-03-14 DIAGNOSIS — E039 Hypothyroidism, unspecified: Secondary | ICD-10-CM | POA: Diagnosis not present

## 2024-03-14 DIAGNOSIS — E291 Testicular hypofunction: Secondary | ICD-10-CM | POA: Diagnosis not present

## 2024-07-07 DIAGNOSIS — E291 Testicular hypofunction: Secondary | ICD-10-CM | POA: Diagnosis not present

## 2024-07-07 DIAGNOSIS — I10 Essential (primary) hypertension: Secondary | ICD-10-CM | POA: Diagnosis not present

## 2024-09-19 ENCOUNTER — Telehealth: Payer: Self-pay | Admitting: Gastroenterology

## 2024-09-19 NOTE — Telephone Encounter (Signed)
 Good afternoon Dr. Wilhelmenia,    I received a call from this patient stating that he would like to make an appointment for a colonoscopy due to him having Bright red blood per rectum.Patient had a colonoscopy with Dr. Donnald 5 years ago. Patient was advised that we would need last colonoscopy records for the provider to review prior to scheduling. Patient immediately got frustrated and stated those records are irrelevant. Patient was refusing to send over records. Patient then proceeded to raise his tone and demanded a call back form Dr. Wilhelmenia. Please review and advise on scheduling.    Thank you.

## 2024-09-20 NOTE — Telephone Encounter (Signed)
 Left message for patient to call back

## 2024-09-21 ENCOUNTER — Telehealth: Payer: Self-pay

## 2024-09-21 NOTE — Telephone Encounter (Signed)
 Please disregard pt not taking

## 2024-09-21 NOTE — Telephone Encounter (Signed)
 Previsit and colon have been set up   09/22/24 at 1 pm  09/26/24 at 230 pm   Pt on plavix - anti coag letter sent

## 2024-09-21 NOTE — Telephone Encounter (Signed)
 The pt reports that he has not taken Plavix  in years and is only on ASA. He did state that he has also stopped taking that as well. He will come in tomorrow for previsit and colon on Monday 1/20

## 2024-09-21 NOTE — Telephone Encounter (Signed)
 I spoke with patient. He has history of prior polyps in past though they were all removed during his colonoscopies and never required surgery or advanced EMR. Last colonoscopy at least 5 years ago. He has had new BRBPR and has history of hemorrhoids but bleeding has been more significant than before. I need him to be scheduled for next available [facility] Colonosocpy for updated surveillance of colon polyps and incidental rectal bleeding. He can be available with notice. Please schedule him with me in the next 2 weeks. Ok to use a 730 slot or double book me wherever needed to get this done please. Thanks. GM

## 2024-09-21 NOTE — Telephone Encounter (Signed)
 [facility] Pre-operative Risk Assessment     Request for surgical clearance:     Endoscopy Procedure  What type of surgery is being performed?     colon  When is this surgery scheduled?     09/26/24  What type of clearance is required ?   Pharmacy  Are there any medications that need to be held prior to surgery and how long? Plavix   Practice name and name of physician performing surgery?      [facility]  What is your office phone and fax number?      Phone- [phone]  Fax- [phone]  Anesthesia type (None, local, MAC, general) ?       MAC   Please route your response to [employer]

## 2024-09-22 ENCOUNTER — Telehealth: Payer: Self-pay

## 2024-09-22 ENCOUNTER — Encounter: Payer: Self-pay | Admitting: Gastroenterology

## 2024-09-22 ENCOUNTER — Ambulatory Visit

## 2024-09-22 VITALS — Ht 69.0 in | Wt 182.0 lb

## 2024-09-22 DIAGNOSIS — K625 Hemorrhage of anus and rectum: Secondary | ICD-10-CM

## 2024-09-22 MED ORDER — NA SULFATE-K SULFATE-MG SULF 17.5-3.13-1.6 GM/177ML PO SOLN: 1.0000 | Freq: Once | ORAL | 0 refills | Status: AC

## 2024-09-22 MED ORDER — ONDANSETRON HCL 4 MG PO TABS: 4.0000 mg | ORAL_TABLET | ORAL | 0 refills | Status: AC

## 2024-09-22 NOTE — Telephone Encounter (Signed)
 Patient has CAD, HTN LBBB and OSA.  He had a cardiac cath on 12/19/21 and is no longer taking Plavix .  His EF is 55-60% and he does have mild thickening of Aortic valve but no evidence of Aortic stenosis.  He was added on for today in Pre visit. Can you take a look at this patients chart and let me know if he meets [facility] requirements or if I need to have him scheduled at the hospital, thank you.

## 2024-09-22 NOTE — Progress Notes (Signed)

## 2024-09-26 ENCOUNTER — Encounter: Payer: Self-pay | Admitting: Gastroenterology

## 2024-09-26 ENCOUNTER — Ambulatory Visit: Admitting: Gastroenterology

## 2024-09-26 VITALS — BP 100/61 | HR 44 | Temp 98.1°F | Resp 12 | Ht 69.0 in | Wt 182.0 lb

## 2024-09-26 DIAGNOSIS — Z860100 Personal history of colon polyps, unspecified: Secondary | ICD-10-CM

## 2024-09-26 DIAGNOSIS — K644 Residual hemorrhoidal skin tags: Secondary | ICD-10-CM | POA: Diagnosis not present

## 2024-09-26 DIAGNOSIS — K635 Polyp of colon: Secondary | ICD-10-CM

## 2024-09-26 DIAGNOSIS — K641 Second degree hemorrhoids: Secondary | ICD-10-CM | POA: Diagnosis not present

## 2024-09-26 DIAGNOSIS — K5730 Diverticulosis of large intestine without perforation or abscess without bleeding: Secondary | ICD-10-CM

## 2024-09-26 DIAGNOSIS — D120 Benign neoplasm of cecum: Secondary | ICD-10-CM

## 2024-09-26 DIAGNOSIS — Z1211 Encounter for screening for malignant neoplasm of colon: Secondary | ICD-10-CM | POA: Diagnosis present

## 2024-09-26 DIAGNOSIS — D122 Benign neoplasm of ascending colon: Secondary | ICD-10-CM

## 2024-09-26 DIAGNOSIS — K625 Hemorrhage of anus and rectum: Secondary | ICD-10-CM

## 2024-09-26 MED ORDER — SODIUM CHLORIDE 0.9 % IV SOLN: 500.0000 mL | INTRAVENOUS | Status: DC

## 2024-09-26 NOTE — Progress Notes (Signed)
 Pt's states no medical or surgical changes since previsit or office visit.

## 2024-09-26 NOTE — Progress Notes (Signed)
 Called to room to assist during endoscopic procedure.  Patient ID and intended procedure confirmed with present staff. Received instructions for my participation in the procedure from the performing physician.

## 2024-09-26 NOTE — Patient Instructions (Signed)
 Use FiberCon 1-2 tablets by mouth daily.  YOU HAD AN ENDOSCOPIC PROCEDURE TODAY AT THE [facility]:   Refer to the procedure report that was given to you for any specific questions about what was found during the examination.  If the procedure report does not answer your questions, please call your gastroenterologist to clarify.  If you requested that your care partner not be given the details of your procedure findings, then the procedure report has been included in a sealed envelope for you to review at your convenience later.  YOU SHOULD EXPECT: Some feelings of bloating in the abdomen. Passage of more gas than usual.  Walking can help get rid of the air that was put into your GI tract during the procedure and reduce the bloating. If you had a lower endoscopy (such as a colonoscopy or flexible sigmoidoscopy) you may notice spotting of blood in your stool or on the toilet paper. If you underwent a bowel prep for your procedure, you may not have a normal bowel movement for a few days.  Please Note:  You might notice some irritation and congestion in your nose or some drainage.  This is from the oxygen used during your procedure.  There is no need for concern and it should clear up in a day or so.  SYMPTOMS TO REPORT IMMEDIATELY:  Following lower endoscopy (colonoscopy or flexible sigmoidoscopy):  Excessive amounts of blood in the stool  Significant tenderness or worsening of abdominal pains  Swelling of the abdomen that is new, acute  Fever of 100F or higher  For urgent or emergent issues, a gastroenterologist can be reached at any hour by calling [phone]. Do not use MyChart messaging for urgent concerns.    DIET:  We do recommend a small meal at first, but then you may proceed to your regular diet.  Drink plenty of fluids but you should avoid alcoholic beverages for 24 hours.  ACTIVITY:  You should plan to take it easy for the rest of today and you should NOT DRIVE or use  heavy machinery until tomorrow (because of the sedation medicines used during the test).    FOLLOW UP: Our staff will call the number listed on your records the next business day following your procedure.  We will call around 7:15- 8:00 am to check on you and address any questions or concerns that you may have regarding the information given to you following your procedure. If we do not reach you, we will leave a message.     If any biopsies were taken you will be contacted by phone or by letter within the next 1-3 weeks.  Please call us  at [phone] if you have not heard about the biopsies in 3 weeks.    SIGNATURES/CONFIDENTIALITY: You and/or your care partner have signed paperwork which will be entered into your electronic medical record.  These signatures attest to the fact that that the information above on your After Visit Summary has been reviewed and is understood.  Full responsibility of the confidentiality of this discharge information lies with you and/or your care-partner.

## 2024-09-26 NOTE — Op Note (Signed)
 [facility] Patient Name: Mark Payne Procedure Date: 09/26/2024 3:16 PM MRN: [id number] Endoscopist: [name] , MD, [id number] Age: 69 Referring MD:  Date of Birth: 1956/01/17 Gender: Male Account #: [id number] Procedure:                Colonoscopy Indications:              High risk colon cancer surveillance: Personal                            history of colonic polyps Medicines:                Monitored Anesthesia Care Procedure:                Pre-Anesthesia Assessment:                           - Prior to the procedure, a History and Physical                            was performed, and patient medications and                            allergies were reviewed. The patient's tolerance of                            previous anesthesia was also reviewed. The risks                            and benefits of the procedure and the sedation                            options and risks were discussed with the patient.                            All questions were answered, and informed consent                            was obtained. Prior Anticoagulants: The patient has                            taken no anticoagulant or antiplatelet agents                            except for aspirin . ASA Grade Assessment: II - A                            patient with mild systemic disease. After reviewing                            the risks and benefits, the patient was deemed in                            satisfactory condition to undergo the procedure.  After obtaining informed consent, the colonoscope                            was passed under direct vision. Throughout the                            procedure, the patient's blood pressure, pulse, and                            oxygen saturations were monitored continuously. The                            CF [id number] #[id number] was introduced through the anus                            and advanced  to the the cecum, identified by the                            appendiceal orifice, IC valve and                            transillumination. The colonoscopy was performed                            without difficulty. The patient tolerated the                            procedure. The quality of the bowel preparation was                            good. The terminal ileum, ileocecal valve,                            appendiceal orifice, and rectum were photographed. Scope In: 3:32:53 PM Scope Out: 3:46:02 PM Scope Withdrawal Time: 0 hours 11 minutes 5 seconds  Total Procedure Duration: 0 hours 13 minutes 9 seconds  Findings:                 The digital rectal exam findings include                            hemorrhoids. Pertinent negatives include no                            palpable rectal lesions.                           The terminal ileum and ileocecal valve appeared                            normal.                           Three sessile polyps were found in the ascending  colon (2) and cecum (1). The polyps were 3 to 6 mm                            in size. These polyps were removed with a cold                            snare. Resection and retrieval were complete.                           Multiple small-mouthed diverticula were found in                            the sigmoid colon, transverse colon, hepatic                            flexure and ascending colon.                           Normal mucosa was found in the entire colon                            otherwise.                           Non-bleeding non-thrombosed external and internal                            hemorrhoids were found during retroflexion, during                            perianal exam and during digital exam. The                            hemorrhoids were Grade II (internal hemorrhoids                            that prolapse but reduce spontaneously). Complications:             No immediate complications. Estimated Blood Loss:     Estimated blood loss was minimal. Impression:               - Hemorrhoids found on digital rectal exam.                           - The examined portion of the ileum was normal.                           - Three 3 to 6 mm polyps in the ascending colon and                            in the cecum, removed with a cold snare. Resected                            and retrieved.                           -  Diverticulosis in the sigmoid colon, in the                            transverse colon, at the hepatic flexure and in the                            ascending colon.                           - Normal mucosa in the entire examined colon                            otherwise.                           - Non-bleeding non-thrombosed external and internal                            hemorrhoids. Recommendation:           - The patient will be observed post-procedure,                            until all discharge criteria are met.                           - Discharge patient to home.                           - Patient has a contact number available for                            emergencies. The signs and symptoms of potential                            delayed complications were discussed with the                            patient. Return to normal activities tomorrow.                            Written discharge instructions were provided to the                            patient.                           - High fiber diet.                           - Use FiberCon 1-2 tablets PO daily.                           - Await pathology results.                           - Repeat colonoscopy in 3/5/7 years for  surveillance and history of previous colon polyps.                           - The findings and recommendations were discussed                            with the patient.                           - The  findings and recommendations were discussed                            with the designated responsible adult. [name], MD 09/26/2024 3:51:21 PM

## 2024-09-26 NOTE — Progress Notes (Signed)
 To pacu, VSS. Report to RN.tb

## 2024-09-26 NOTE — Progress Notes (Signed)
 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  Past Medical History:  Diagnosis Date   Coronary artery disease involving native coronary artery with angina pectoris 04/02/2014   LIMA to LAD and free radial to diagonal #[id number] // s/p PCI of RCA with DES May 2011 and DES to RCA 05/18/14 // MV 11/13/14: Low risk with significant artifacts but no ischemia.  EF 53 // LHC 3/16: pLAD 95, 80; LCx irregs; pRCA stents ok, mRCA 40-50, L-LAD ok, free Rad-Dx ok, EF 45-50 // MV 4/17: ant-sept, apical defect, partially reversible inf-lat defect, EF 51 (no change from prior) >> Med Rx.    Essential hypertension 04/02/2014   History of echocardiogram    Echo 4/18: normal LV systolic function (EF 56), no RWMA   Hyperlipidemia 04/02/2014   LBBB (left bundle branch block) 04/02/2014   OSA (obstructive sleep apnea) 04/11/2018   Past Surgical History:  Procedure Laterality Date   LEFT HEART CATH AND CORS/GRAFTS ANGIOGRAPHY N/A 12/19/2021   Procedure: LEFT HEART CATH AND CORS/GRAFTS ANGIOGRAPHY;  Surgeon: Claudene Victory ORN, MD;  Location: [facility] INVASIVE CV LAB;  Service: Cardiovascular;  Laterality: N/A;   LEFT HEART CATHETERIZATION WITH CORONARY/GRAFT ANGIOGRAM N/A 11/13/2011   Procedure: LEFT HEART CATHETERIZATION WITH EL BILE;  Surgeon: Victory ORN Claudene DOUGLAS, MD;  Location: [facility] CATH LAB;  Service: Cardiovascular;  Laterality: N/A;   LEFT HEART CATHETERIZATION WITH CORONARY/GRAFT ANGIOGRAM N/A 05/18/2014   Procedure: LEFT HEART CATHETERIZATION WITH EL BILE;  Surgeon: Victory ORN Claudene DOUGLAS, MD;  Location: [facility] CATH LAB;  Service: Cardiovascular;  Laterality: N/A;   LEFT HEART CATHETERIZATION WITH CORONARY/GRAFT ANGIOGRAM N/A 11/30/2014   Procedure: LEFT HEART CATHETERIZATION WITH EL BILE;  Surgeon: Victory ORN Claudene, MD;  Location: [facility] CATH LAB;  Service:  Cardiovascular;  Laterality: N/A;   Current Outpatient Medications  Medication Sig Dispense Refill   aspirin  81 MG tablet Take 81 mg by mouth daily.     clopidogrel  (PLAVIX ) 75 MG tablet Take 75 mg by mouth. (Patient not taking: Reported on 09/22/2024)     ezetimibe  (ZETIA ) 10 MG tablet Take 1 tablet (10 mg total) by mouth daily. 30 tablet 11   levothyroxine  (SYNTHROID , LEVOTHROID) 150 MCG tablet Take 150 mcg by mouth daily before breakfast.     Multiple Vitamins-Minerals (MULTIVITAMIN MEN 50+) TABS Take 1 tablet po qd Oral     nebivolol  (BYSTOLIC ) 5 MG tablet Take 1 qd (uses days that works, uses 3-4 days a week)     ondansetron  (ZOFRAN ) 4 MG tablet Take 1 tablet (4 mg total) by mouth as directed. Take one Zofran  4 mg tablet 30-60 minutes before each colonoscopy prep dose 4 tablet 0   rosuvastatin  (CRESTOR ) 20 MG tablet Take 1 tablet (20 mg total) by mouth daily. 90 tablet 3   testosterone  cypionate (DEPOTESTOTERONE CYPIONATE) 100 MG/ML injection 1/2 mL Intramuscular every 2 weeks; Duration: 86 days     No current facility-administered medications for this visit.   Current Medications[1] Allergies[2] Family History  Problem Relation Age of Onset   Heart disease Mother    Breast cancer Mother    CAD Father    Healthy Brother    Healthy Brother    Healthy Brother    Rectal cancer Maternal Grandmother    Colon polyps Maternal Grandmother    Colon cancer Maternal Grandmother    Esophageal cancer  Neg Hx    Stomach cancer Neg Hx    Social History   Socioeconomic History   Marital status: Married    Spouse name: Not on file   Number of children: Not on file   Years of education: Not on file   Highest education level: Not on file  Occupational History   Occupation: physician    Comment: Anesthesiologist  Tobacco Use   Smoking status: Former    Types: Cigarettes   Smokeless tobacco: Never  Vaping Use   Vaping status: Never Used  Substance and Sexual Activity   Alcohol  use:  Not Currently   Drug use: No   Sexual activity: Yes  Other Topics Concern   Not on file  Social History Narrative   Not on file   Social Drivers of Health   Tobacco Use: Medium Risk (09/22/2024)   Patient History    Smoking Tobacco Use: Former    Smokeless Tobacco Use: Never    Passive Exposure: Not on Actuary Strain: Not on file  Food Insecurity: Not on file  Transportation Needs: Not on file  Physical Activity: Not on file  Stress: Not on file  Social Connections: Not on file  Intimate Partner Violence: Not on file  Depression (EYV7-0): Not on file  Alcohol  Screen: Not on file  Housing: Not on file  Utilities: Not on file  Health Literacy: Not on file    Physical Exam: There were no vitals filed for this visit. There is no height or weight on file to calculate BMI. GEN: NAD EYE: Sclerae anicteric ENT: MMM CV: Non-tachycardic GI: Soft, NT/ND NEURO:  Alert & Oriented x 3  Lab Results: No results for input(s): WBC, HGB, HCT, PLT in the last 72 hours. BMET No results for input(s): NA, K, CL, CO2, GLUCOSE, BUN, CREATININE, CALCIUM  in the last 72 hours. LFT No results for input(s): PROT, ALBUMIN, AST, ALT, ALKPHOS, BILITOT, BILIDIR, IBILI in the last 72 hours. PT/INR No results for input(s): LABPROT, INR in the last 72 hours.   Impression / Plan: This is a 69 y.o.male who presents for Colonoscopy for polyp surveillance.  The risks and benefits of endoscopic evaluation/treatment were discussed with the patient and/or family; these include but are not limited to the risk of perforation, infection, bleeding, missed lesions, lack of diagnosis, severe illness requiring hospitalization, as well as anesthesia and sedation related illnesses.  The patient's history has been reviewed, patient examined, no change in status, and deemed stable for procedure.  The patient and/or family was provided an opportunity to ask  questions and all were answered.  The patient and/or family is agreeable to proceed.    Mark Finner, MD [facility] Advanced Endoscopy Office # [phone]     [1]  Current Outpatient Medications:    aspirin  81 MG tablet, Take 81 mg by mouth daily., Disp: , Rfl:    clopidogrel  (PLAVIX ) 75 MG tablet, Take 75 mg by mouth. (Patient not taking: Reported on 09/22/2024), Disp: , Rfl:    ezetimibe  (ZETIA ) 10 MG tablet, Take 1 tablet (10 mg total) by mouth daily., Disp: 30 tablet, Rfl: 11   levothyroxine  (SYNTHROID , LEVOTHROID) 150 MCG tablet, Take 150 mcg by mouth daily before breakfast., Disp: , Rfl:    Multiple Vitamins-Minerals (MULTIVITAMIN MEN 50+) TABS, Take 1 tablet po qd Oral, Disp: , Rfl:    nebivolol  (BYSTOLIC ) 5 MG tablet, Take 1 qd (uses days that works, uses 3-4 days a week), Disp: , Rfl:  ondansetron  (ZOFRAN ) 4 MG tablet, Take 1 tablet (4 mg total) by mouth as directed. Take one Zofran  4 mg tablet 30-60 minutes before each colonoscopy prep dose, Disp: 4 tablet, Rfl: 0   rosuvastatin  (CRESTOR ) 20 MG tablet, Take 1 tablet (20 mg total) by mouth daily., Disp: 90 tablet, Rfl: 3   testosterone  cypionate (DEPOTESTOTERONE CYPIONATE) 100 MG/ML injection, 1/2 mL Intramuscular every 2 weeks; Duration: 86 days, Disp: , Rfl:  [2]  Allergies Allergen Reactions   Promethazine Hcl Other (See Comments)   Statins Other (See Comments)   Vistaril [Hydroxyzine Hcl]     PATIENT HAD NAUSEA IT MADE HIM REALLY SICK   Zocor [Simvastatin]     Cranial nerve palsy   "

## 2024-09-27 ENCOUNTER — Telehealth: Payer: Self-pay

## 2024-09-27 NOTE — Telephone Encounter (Signed)
" °  Follow up Call-     09/26/2024    3:01 PM  Call back number  Post procedure Call Back phone  # [phone]  Permission to leave phone message Yes     Patient questions:  Do you have a fever, pain , or abdominal swelling? No. Pain Score  0 *  Have you tolerated food without any problems? Yes.    Have you been able to return to your normal activities? Yes.    Do you have any questions about your discharge instructions: Diet   No. Medications  No. Follow up visit  No.  Do you have questions or concerns about your Care? Yes.    Actions: * If pain score is 4 or above: No action needed, pain <4.   "

## 2024-09-29 ENCOUNTER — Ambulatory Visit: Payer: Self-pay | Admitting: Gastroenterology

## 2024-09-29 LAB — SURGICAL PATHOLOGY
